# Patient Record
Sex: Female | Born: 1995 | Hispanic: Yes | Marital: Single | Smoking: Never smoker
Health system: Southern US, Community
[De-identification: ages and names within clinical notes are randomized; demographics above are authoritative.]

## PROBLEM LIST (undated history)

## (undated) ENCOUNTER — Inpatient Hospital Stay (HOSPITAL_COMMUNITY): Payer: Self-pay

## (undated) DIAGNOSIS — Z789 Other specified health status: Secondary | ICD-10-CM

## (undated) DIAGNOSIS — O26619 Liver and biliary tract disorders in pregnancy, unspecified trimester: Secondary | ICD-10-CM

## (undated) DIAGNOSIS — K831 Obstruction of bile duct: Secondary | ICD-10-CM

## (undated) DIAGNOSIS — O26649 Intrahepatic cholestasis of pregnancy, unspecified trimester: Secondary | ICD-10-CM

## (undated) HISTORY — DX: Obstruction of bile duct: K83.1

## (undated) HISTORY — DX: Intrahepatic cholestasis of pregnancy, unspecified trimester: O26.649

## (undated) HISTORY — DX: Obstruction of bile duct: O26.619

## (undated) HISTORY — PX: NO PAST SURGERIES: SHX2092

---
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
## 2015-01-06 ENCOUNTER — Ambulatory Visit (HOSPITAL_COMMUNITY)
Admission: RE | Admit: 2015-01-06 | Discharge: 2015-01-06 | Disposition: A | Discharge status: Home / Self Care | Payer: BLUE CROSS/BLUE SHIELD | Source: Ambulatory Visit | Attending: Nurse Practitioner | Admitting: Nurse Practitioner

## 2015-01-06 DIAGNOSIS — O43199 Other malformation of placenta, unspecified trimester: Secondary | ICD-10-CM | POA: Insufficient documentation

## 2015-01-06 DIAGNOSIS — Z364 Encounter for antenatal screening for fetal growth retardation: Secondary | ICD-10-CM | POA: Insufficient documentation

## 2015-01-06 DIAGNOSIS — Z3A33 33 weeks gestation of pregnancy: Secondary | ICD-10-CM | POA: Insufficient documentation

## 2015-01-06 DIAGNOSIS — Z36 Encounter for antenatal screening of mother: Secondary | ICD-10-CM | POA: Diagnosis not present

## 2015-01-06 DIAGNOSIS — Z3689 Encounter for other specified antenatal screening: Secondary | ICD-10-CM

## 2015-01-06 DIAGNOSIS — O43893 Other placental disorders, third trimester: Secondary | ICD-10-CM | POA: Insufficient documentation

## 2015-01-06 DIAGNOSIS — Z3A34 34 weeks gestation of pregnancy: Secondary | ICD-10-CM | POA: Insufficient documentation

## 2015-01-06 NOTE — Progress Notes (Signed)
Created in error

## 2015-01-13 ENCOUNTER — Other Ambulatory Visit (HOSPITAL_COMMUNITY): Payer: Self-pay | Admitting: Nurse Practitioner

## 2015-01-13 DIAGNOSIS — K831 Obstruction of bile duct: Secondary | ICD-10-CM

## 2015-01-13 DIAGNOSIS — O26613 Liver and biliary tract disorders in pregnancy, third trimester: Principal | ICD-10-CM

## 2015-01-15 ENCOUNTER — Inpatient Hospital Stay (HOSPITAL_COMMUNITY)
Admission: AD | Admit: 2015-01-15 | Discharge: 2015-01-18 | DRG: 775 | Disposition: A | Discharge status: Home / Self Care | Payer: BLUE CROSS/BLUE SHIELD | Source: Ambulatory Visit | Attending: Obstetrics & Gynecology | Admitting: Obstetrics & Gynecology

## 2015-01-15 ENCOUNTER — Encounter (HOSPITAL_COMMUNITY): Payer: Self-pay | Admitting: *Deleted

## 2015-01-15 ENCOUNTER — Inpatient Hospital Stay (HOSPITAL_COMMUNITY)
Admission: AD | Admit: 2015-01-15 | Discharge: 2015-01-15 | Disposition: A | Discharge status: Home / Self Care | Payer: BLUE CROSS/BLUE SHIELD | Source: Ambulatory Visit | Attending: Family Medicine | Admitting: Family Medicine

## 2015-01-15 ENCOUNTER — Inpatient Hospital Stay (HOSPITAL_COMMUNITY): Payer: BLUE CROSS/BLUE SHIELD | Admitting: Anesthesiology

## 2015-01-15 DIAGNOSIS — O26613 Liver and biliary tract disorders in pregnancy, third trimester: Secondary | ICD-10-CM | POA: Diagnosis present

## 2015-01-15 DIAGNOSIS — Z87891 Personal history of nicotine dependence: Secondary | ICD-10-CM | POA: Diagnosis not present

## 2015-01-15 DIAGNOSIS — O0933 Supervision of pregnancy with insufficient antenatal care, third trimester: Secondary | ICD-10-CM | POA: Diagnosis present

## 2015-01-15 DIAGNOSIS — K831 Obstruction of bile duct: Secondary | ICD-10-CM | POA: Insufficient documentation

## 2015-01-15 DIAGNOSIS — R509 Fever, unspecified: Secondary | ICD-10-CM | POA: Diagnosis not present

## 2015-01-15 DIAGNOSIS — B001 Herpesviral vesicular dermatitis: Secondary | ICD-10-CM | POA: Diagnosis not present

## 2015-01-15 DIAGNOSIS — Z3A35 35 weeks gestation of pregnancy: Secondary | ICD-10-CM | POA: Diagnosis present

## 2015-01-15 DIAGNOSIS — O411030 Infection of amniotic sac and membranes, unspecified, third trimester, not applicable or unspecified: Secondary | ICD-10-CM | POA: Diagnosis present

## 2015-01-15 DIAGNOSIS — B009 Herpesviral infection, unspecified: Secondary | ICD-10-CM

## 2015-01-15 LAB — URINALYSIS, ROUTINE W REFLEX MICROSCOPIC
Glucose, UA: NEGATIVE mg/dL
Hgb urine dipstick: NEGATIVE
KETONES UR: NEGATIVE mg/dL
Leukocytes, UA: NEGATIVE
Nitrite: NEGATIVE
PH: 6.5 (ref 5.0–8.0)
Protein, ur: NEGATIVE mg/dL
Specific Gravity, Urine: 1.015 (ref 1.005–1.030)
Urobilinogen, UA: 4 mg/dL — ABNORMAL HIGH (ref 0.0–1.0)

## 2015-01-15 LAB — CBC
HEMATOCRIT: 38.2 % (ref 36.0–46.0)
Hemoglobin: 12.9 g/dL (ref 12.0–15.0)
MCH: 28.5 pg (ref 26.0–34.0)
MCHC: 33.8 g/dL (ref 30.0–36.0)
MCV: 84.5 fL (ref 78.0–100.0)
PLATELETS: 165 10*3/uL (ref 150–400)
RBC: 4.52 MIL/uL (ref 3.87–5.11)
RDW: 13.5 % (ref 11.5–15.5)
WBC: 13.1 10*3/uL — ABNORMAL HIGH (ref 4.0–10.5)

## 2015-01-15 LAB — RAPID HIV SCREEN (WH-MAU): Rapid HIV Screen: NONREACTIVE

## 2015-01-15 LAB — GROUP B STREP BY PCR: Group B strep by PCR: NEGATIVE

## 2015-01-15 LAB — TYPE AND SCREEN
ABO/RH(D): A POS
Antibody Screen: NEGATIVE

## 2015-01-15 LAB — OB RESULTS CONSOLE GBS: STREP GROUP B AG: NEGATIVE

## 2015-01-15 MED ORDER — PROMETHAZINE HCL 25 MG/ML IJ SOLN
12.5000 mg | Freq: Four times a day (QID) | INTRAMUSCULAR | Status: DC | PRN
Start: 2015-01-15 — End: 2015-01-15
  Administered 2015-01-15: 12.5 mg via INTRAMUSCULAR

## 2015-01-15 MED ORDER — SODIUM CHLORIDE 0.9 % IJ SOLN
3.0000 mL | INTRAMUSCULAR | Status: DC | PRN
  Administered 2015-01-17: 3 mL via INTRAVENOUS
  Filled 2015-01-15: qty 3

## 2015-01-15 MED ORDER — PROMETHAZINE HCL 25 MG/ML IJ SOLN
25.0000 mg | Freq: Four times a day (QID) | INTRAMUSCULAR | Status: DC | PRN
  Filled 2015-01-15: qty 1

## 2015-01-15 MED ORDER — ZOLPIDEM TARTRATE 5 MG PO TABS: 5.0000 mg | ORAL_TABLET | Freq: Every evening | ORAL | Status: DC | PRN

## 2015-01-15 MED ORDER — LACTATED RINGERS IV SOLN
INTRAVENOUS | Status: DC
  Administered 2015-01-15: 13:00:00 via INTRAVENOUS

## 2015-01-15 MED ORDER — ONDANSETRON HCL 4 MG/2ML IJ SOLN: 4.0000 mg | Freq: Four times a day (QID) | INTRAMUSCULAR | Status: DC | PRN

## 2015-01-15 MED ORDER — PHENYLEPHRINE 40 MCG/ML (10ML) SYRINGE FOR IV PUSH (FOR BLOOD PRESSURE SUPPORT)
80.0000 ug | PREFILLED_SYRINGE | INTRAVENOUS | Status: DC | PRN
  Filled 2015-01-15: qty 2

## 2015-01-15 MED ORDER — PHENYLEPHRINE 40 MCG/ML (10ML) SYRINGE FOR IV PUSH (FOR BLOOD PRESSURE SUPPORT)
PREFILLED_SYRINGE | INTRAVENOUS | Status: AC
  Filled 2015-01-15: qty 20

## 2015-01-15 MED ORDER — DIPHENHYDRAMINE HCL 25 MG PO CAPS: 25.0000 mg | ORAL_CAPSULE | Freq: Four times a day (QID) | ORAL | Status: DC | PRN

## 2015-01-15 MED ORDER — OXYCODONE-ACETAMINOPHEN 5-325 MG PO TABS
2.0000 | ORAL_TABLET | ORAL | Status: DC | PRN
  Administered 2015-01-17 – 2015-01-18 (×2): 2 via ORAL

## 2015-01-15 MED ORDER — OXYCODONE-ACETAMINOPHEN 5-325 MG PO TABS
1.0000 | ORAL_TABLET | ORAL | Status: DC | PRN
Start: 2015-01-15 — End: 2015-01-18

## 2015-01-15 MED ORDER — BENZOCAINE-MENTHOL 20-0.5 % EX AERO
1.0000 "application " | INHALATION_SPRAY | CUTANEOUS | Status: DC | PRN
  Administered 2015-01-16: 1 via TOPICAL
  Filled 2015-01-15: qty 56

## 2015-01-15 MED ORDER — LIDOCAINE HCL (PF) 1 % IJ SOLN
30.0000 mL | INTRAMUSCULAR | Status: DC | PRN
  Filled 2015-01-15: qty 30

## 2015-01-15 MED ORDER — FENTANYL 2.5 MCG/ML BUPIVACAINE 1/10 % EPIDURAL INFUSION (WH - ANES)
INTRAMUSCULAR | Status: DC | PRN
  Administered 2015-01-15: 14 mL/h via EPIDURAL

## 2015-01-15 MED ORDER — DIPHENHYDRAMINE HCL 50 MG/ML IJ SOLN: 12.5000 mg | INTRAMUSCULAR | Status: DC | PRN

## 2015-01-15 MED ORDER — FLEET ENEMA 7-19 GM/118ML RE ENEM: 1.0000 | ENEMA | RECTAL | Status: DC | PRN

## 2015-01-15 MED ORDER — ACETAMINOPHEN 325 MG PO TABS
650.0000 mg | ORAL_TABLET | ORAL | Status: DC | PRN
  Administered 2015-01-15 – 2015-01-17 (×3): 650 mg via ORAL
  Filled 2015-01-15 (×3): qty 2

## 2015-01-15 MED ORDER — OXYTOCIN 40 UNITS IN LACTATED RINGERS INFUSION - SIMPLE MED: 1.0000 m[IU]/min | INTRAVENOUS | Status: DC

## 2015-01-15 MED ORDER — IBUPROFEN 600 MG PO TABS
600.0000 mg | ORAL_TABLET | Freq: Four times a day (QID) | ORAL | Status: DC
  Administered 2015-01-16 – 2015-01-18 (×11): 600 mg via ORAL
  Filled 2015-01-15 (×11): qty 1

## 2015-01-15 MED ORDER — LACTATED RINGERS IV SOLN
500.0000 mL | Freq: Once | INTRAVENOUS | Status: AC
  Administered 2015-01-15: 500 mL via INTRAVENOUS

## 2015-01-15 MED ORDER — FENTANYL 2.5 MCG/ML BUPIVACAINE 1/10 % EPIDURAL INFUSION (WH - ANES)
14.0000 mL/h | INTRAMUSCULAR | Status: DC | PRN
  Administered 2015-01-15: 14 mL/h via EPIDURAL

## 2015-01-15 MED ORDER — OXYTOCIN 40 UNITS IN LACTATED RINGERS INFUSION - SIMPLE MED: 62.5000 mL/h | INTRAVENOUS | Status: DC | PRN

## 2015-01-15 MED ORDER — FENTANYL CITRATE 0.05 MG/ML IJ SOLN
100.0000 ug | INTRAMUSCULAR | Status: DC | PRN
  Administered 2015-01-15 (×2): 100 ug via INTRAVENOUS
  Filled 2015-01-15: qty 2

## 2015-01-15 MED ORDER — LANOLIN HYDROUS EX OINT: TOPICAL_OINTMENT | CUTANEOUS | Status: DC | PRN

## 2015-01-15 MED ORDER — SODIUM CHLORIDE 0.9 % IJ SOLN
3.0000 mL | Freq: Two times a day (BID) | INTRAMUSCULAR | Status: DC
  Administered 2015-01-17 (×2): 3 mL via INTRAVENOUS

## 2015-01-15 MED ORDER — OXYTOCIN 40 UNITS IN LACTATED RINGERS INFUSION - SIMPLE MED
62.5000 mL/h | INTRAVENOUS | Status: DC
  Filled 2015-01-15: qty 1000

## 2015-01-15 MED ORDER — LACTATED RINGERS IV SOLN: 500.0000 mL | INTRAVENOUS | Status: DC | PRN

## 2015-01-15 MED ORDER — LIDOCAINE HCL (PF) 1 % IJ SOLN
INTRAMUSCULAR | Status: DC | PRN
  Administered 2015-01-15 (×2): 4 mL

## 2015-01-15 MED ORDER — SIMETHICONE 80 MG PO CHEW: 80.0000 mg | CHEWABLE_TABLET | ORAL | Status: DC | PRN

## 2015-01-15 MED ORDER — OXYCODONE-ACETAMINOPHEN 5-325 MG PO TABS
2.0000 | ORAL_TABLET | ORAL | Status: DC | PRN
  Filled 2015-01-15 (×2): qty 2

## 2015-01-15 MED ORDER — BUTORPHANOL TARTRATE 1 MG/ML IJ SOLN
2.0000 mg | Freq: Once | INTRAMUSCULAR | Status: AC
  Administered 2015-01-15: 2 mg via INTRAMUSCULAR
  Filled 2015-01-15: qty 2

## 2015-01-15 MED ORDER — TETANUS-DIPHTH-ACELL PERTUSSIS 5-2.5-18.5 LF-MCG/0.5 IM SUSP: 0.5000 mL | Freq: Once | INTRAMUSCULAR | Status: DC

## 2015-01-15 MED ORDER — WITCH HAZEL-GLYCERIN EX PADS: 1.0000 "application " | MEDICATED_PAD | CUTANEOUS | Status: DC | PRN

## 2015-01-15 MED ORDER — OXYTOCIN BOLUS FROM INFUSION
500.0000 mL | INTRAVENOUS | Status: DC
  Administered 2015-01-15: 500 mL via INTRAVENOUS

## 2015-01-15 MED ORDER — FENTANYL CITRATE 0.05 MG/ML IJ SOLN
INTRAMUSCULAR | Status: AC
  Filled 2015-01-15: qty 2

## 2015-01-15 MED ORDER — CITRIC ACID-SODIUM CITRATE 334-500 MG/5ML PO SOLN: 30.0000 mL | ORAL | Status: DC | PRN

## 2015-01-15 MED ORDER — DIBUCAINE 1 % RE OINT: 1.0000 "application " | TOPICAL_OINTMENT | RECTAL | Status: DC | PRN

## 2015-01-15 MED ORDER — FENTANYL 2.5 MCG/ML BUPIVACAINE 1/10 % EPIDURAL INFUSION (WH - ANES)
INTRAMUSCULAR | Status: AC
  Filled 2015-01-15: qty 125

## 2015-01-15 MED ORDER — TERBUTALINE SULFATE 1 MG/ML IJ SOLN: 0.2500 mg | Freq: Once | INTRAMUSCULAR | Status: DC | PRN

## 2015-01-15 MED ORDER — SENNOSIDES-DOCUSATE SODIUM 8.6-50 MG PO TABS
2.0000 | ORAL_TABLET | ORAL | Status: DC
  Administered 2015-01-16 – 2015-01-17 (×3): 2 via ORAL
  Filled 2015-01-15 (×3): qty 2

## 2015-01-15 MED ORDER — ONDANSETRON HCL 4 MG/2ML IJ SOLN: 4.0000 mg | INTRAMUSCULAR | Status: DC | PRN

## 2015-01-15 MED ORDER — EPHEDRINE 5 MG/ML INJ
10.0000 mg | INTRAVENOUS | Status: DC | PRN
  Filled 2015-01-15: qty 2

## 2015-01-15 MED ORDER — ONDANSETRON HCL 4 MG PO TABS: 4.0000 mg | ORAL_TABLET | ORAL | Status: DC | PRN

## 2015-01-15 MED ORDER — OXYCODONE-ACETAMINOPHEN 5-325 MG PO TABS: 1.0000 | ORAL_TABLET | ORAL | Status: DC | PRN

## 2015-01-15 MED ORDER — SODIUM CHLORIDE 0.9 % IV SOLN: 250.0000 mL | INTRAVENOUS | Status: DC | PRN

## 2015-01-15 MED ORDER — PRENATAL MULTIVITAMIN CH
1.0000 | ORAL_TABLET | Freq: Every day | ORAL | Status: DC
  Administered 2015-01-16 – 2015-01-18 (×3): 1 via ORAL
  Filled 2015-01-15 (×3): qty 1

## 2015-01-15 NOTE — Progress Notes (Signed)
Karina [name] is a 19 y.o. G1P0 at 2524w6d by ultrasound admitted for active labor  Subjective:   Objective: BP 121/61 mmHg  Pulse 93  Temp(Src) 97.9 F (36.6 C) (Oral)  Resp 18  SpO2 100%  LMP 04/17/2014      FHT:  FHR: 135 bpm, variability: moderate,  accelerations:  Present,  decelerations:  Absent UC:   regular, every 2  minutes SVE:   Dilation: Lip/rim Effacement (%): 100 Station: 0 Exam by:: Karina [name] CNM   Labs: Lab Results  Component Value Date   WBC 13.1* 01/15/2015   HGB 12.9 01/15/2015   HCT 38.2 01/15/2015   MCV 84.5 01/15/2015   PLT 165 01/15/2015    Assessment / Plan: Spontaneous labor, progressing normally  Labor: Progressing normally Preeclampsia:  no signs or symptoms of toxicity Fetal Wellbeing:  Category I Pain Control:  Epidural I/D:  n/a Anticipated MOD:  NSVD  Karina [name] 01/15/2015, 1:08 PM

## 2015-01-15 NOTE — Progress Notes (Signed)
MD reviewing risks and benefits with the pt and pt deciding to procede with the procedure

## 2015-01-15 NOTE — Progress Notes (Signed)
Written and verbal d/c instructions given and understanding voiced. 

## 2015-01-15 NOTE — H&P (Signed)
Karina [name] is a 19 y.o. female presenting for labor. Maternal Medical History:  Reason for admission: Contractions.   Contractions: Onset was 13-24 hours ago.   Frequency: regular.   Perceived severity is moderate.    Fetal activity: Perceived fetal activity is normal.   Last perceived fetal movement was within the past hour.      OB History    Gravida Para Term Preterm AB TAB SAB Ectopic Multiple Living   1              Past Medical History  Diagnosis Date  . Medical history non-contributory    Past Surgical History  Procedure Laterality Date  . No past surgeries     Family History: family history includes Hyperlipidemia in her father. Social History:  reports that she has quit smoking. She has never used smokeless tobacco. She reports that she does not drink alcohol or use illicit drugs.   Prenatal Transfer Tool  Maternal Diabetes: No Genetic Screening: Normal Maternal Ultrasounds/Referrals: Normal Fetal Ultrasounds or other Referrals:  None Maternal Substance Abuse:  No Significant Maternal Medications:  None Significant Maternal Lab Results:  None Other Comments:  pt is 35.[redacted] wks gestation  Review of Systems  Constitutional: Negative.   HENT: Negative.   Eyes: Negative.   Respiratory: Negative.   Cardiovascular: Negative.   Gastrointestinal: Positive for abdominal pain.  Genitourinary: Negative.   Musculoskeletal: Negative.   Skin: Negative.   Neurological: Negative.   Endo/Heme/Allergies: Negative.   Psychiatric/Behavioral: Negative.     Dilation: 7 Effacement (%): 100 Station: -1 Exam by:: jolynn Blood pressure 111/77, pulse 82, temperature 97.9 F (36.6 C), temperature source Oral, resp. rate 18, last menstrual period 04/17/2014. Maternal Exam:  Uterine Assessment: Contraction strength is moderate.  Contraction frequency is regular.   Abdomen: Fetal presentation: vertex  Introitus: Normal vulva.   Physical Exam  Constitutional: She  is oriented to person, place, and time. She appears well-developed and well-nourished.  HENT:  Head: Normocephalic.  Eyes: Pupils are equal, round, and reactive to light.  Cardiovascular: Normal rate, regular rhythm, normal heart sounds and intact distal pulses.   Respiratory: Effort normal and breath sounds normal.  GI: Soft. Bowel sounds are normal.  Genitourinary: Vagina normal.  Musculoskeletal: Normal range of motion.  Neurological: She is alert and oriented to person, place, and time. She has normal reflexes.  Skin: Skin is warm and dry.  Psychiatric: She has a normal mood and affect. Her behavior is normal. Judgment and thought content normal.    Prenatal labs: ABO, Rh: --/--/A POS (12/13 16100635) Antibody: NEG (12/13 0635) Rubella: 1.49 (12/13 0635) RPR: NON REAC (12/13 0635)  HBsAg: NEGATIVE (12/13 0635)  HIV: NONREACTIVE (12/13 [id number])  GBS:     Assessment/Plan: Admit and expect vag delivery   Karina [name] 01/15/2015, 9:27 AM

## 2015-01-15 NOTE — Progress Notes (Signed)
No progress with pushing, pt to labor down per Zerita Boersarlene Eura Mccauslin CNM

## 2015-01-15 NOTE — Anesthesia Procedure Notes (Signed)
Epidural Patient location during procedure: OB Start time: 01/15/2015 12:15 PM  Staffing Anesthesiologist: Karie SchwalbeJUDD, Elmore Hyslop JENNETTE Performed by: anesthesiologist   Preanesthetic Checklist Completed: patient identified, site marked, surgical consent, pre-op evaluation, timeout performed, IV checked, risks and benefits discussed and monitors and equipment checked  Epidural Patient position: sitting Prep: site prepped and draped and DuraPrep Patient monitoring: continuous pulse ox and blood pressure Approach: midline Location: L3-L4 Injection technique: LOR saline  Needle:  Needle type: Tuohy  Needle gauge: 17 G Needle length: 9 cm and 9 Needle insertion depth: 5 cm cm Catheter type: closed end flexible Catheter size: 19 Gauge Catheter at skin depth: 9 cm Test dose: negative  Assessment Events: blood not aspirated, injection not painful, no injection resistance, negative IV test and no paresthesia  Additional Notes Patient identified. Risks/Benefits/Options discussed with patient including but not limited to bleeding, infection, nerve damage, paralysis, failed block, incomplete pain control, headache, blood pressure changes, nausea, vomiting, reactions to medication both or allergic, itching and postpartum back pain. Confirmed with bedside nurse the patient's most recent platelet count. Confirmed with patient that they are not currently taking any anticoagulation, have any bleeding history or any family history of bleeding disorders. Patient expressed understanding and wished to proceed. All questions were answered. Sterile technique was used throughout the entire procedure. Please see nursing notes for vital signs. Test dose was given through epidural catheter and negative prior to continuing to dose epidural or start infusion. Warning signs of high block given to the patient including shortness of breath, tingling/numbness in hands, complete motor block, or any concerning symptoms with  instructions to call for help. Patient was given instructions on fall risk and not to get out of bed. All questions and concerns addressed with instructions to call with any issues or inadequate analgesia.

## 2015-01-15 NOTE — Discharge Instructions (Signed)
Preterm Birth °Preterm birth is a birth that happens before 37 weeks of pregnancy. Most pregnancies last about 39-41 weeks. Every week in the womb is important and is beneficial to the health of the infant. Infants born before 37 weeks of pregnancy are at a higher risk for complications. Depending on when the infant was born, he or she may be: °· Late preterm. Born between 32 weeks and 37 weeks of pregnancy. °· Very preterm. Born at less than 32 weeks of pregnancy. °· Extremely preterm. Born at less than 25 weeks of pregnancy. °The earlier a baby is born, the more likely the child will have issues related to prematurity. Complications and problems that can be seen in infants born too early include: °· Problems breathing (respiratory distress syndrome). °· Low birth weight. °· Problems feeding. °· Sleeping problems. °· Yellowing of the skin (jaundice). °· Infections such as pneumonia.  °Babies born very preterm or extremely preterm are at risk for more serious medical issues. These include: °· More severe breathing issues. °· Eyesight issues. °· Brain development issues (intraventricular hemorrhage). °· Behavioral and emotional development issues. °· Growth and developmental delays. °· Cerebral palsy. °· Serious feeding or bowel complications (necrotizing enterocolitis). °CAUSES  °There are two broad categories of preterm birth. °· Spontaneous preterm birth. This is a birth resulting from preterm labor (not medically induced) or preterm premature rupture of membranes (PPROM). °· Indicated preterm birth. This is a birth resulting from labor being medically induced due to health, personal, or social reasons. °RISK FACTORS °Preterm birth may be related to certain medical conditions, lifestyle factors, or demographic factors encountered by the mother or fetus. °· Medical conditions include: °¨ Multiple gestations (twins, triplets, and so on). °¨ Infection. °¨ Diabetes. °¨ Heart disease. °¨ Kidney disease. °¨ Cervical or  uterine abnormalities. °¨ Being underweight. °¨ High blood pressure or preeclampsia. °¨ Premature rupture of membranes (PROM). °¨ Birth defects in the fetus. °· Lifestyle factors include: °¨ Poor prenatal care. °¨ Poor nutrition or anemia. °¨ Cigarette smoking. °¨ Consuming alcohol. °¨ High levels of stress and lack of social or emotional support. °¨ Exposure to chemical or environmental toxins. °¨ Substance abuse. °· Demographic factors include: °¨ African-American ethnicity. °¨ Age (younger than 18 or older than 19 years of age). °¨ Low socioeconomic status. °Women with a history of preterm labor or who become pregnant within 18 months of giving birth are also at increased risk for preterm birth. °DIAGNOSIS  °Your health care provider may request additional tests to diagnose underlying complications resulting from preterm birth. Tests on the infant may include: °· Physical exam. °· Blood tests. °· Chest X-rays. °· Heart-lung monitoring. °TREATMENT  °After birth, special care will be taken to assess any problems or complications for the infant. Supportive care will be provided for the infant. Treatment depends on what problems are present and any complications that develop. Some preterm infants are cared for in a neonatal intensive care unit. In general, care may include: °· Maintaining temperature and oxygen in a clear heated box (baby isolette). °· Monitoring the infant's heart rate, breathing, and level of oxygen in the blood. °· Monitoring for signs of infection and, if needed, giving IV antibiotic medicine. °· Inserting a feeding tube (nose, mouth) or giving IV nutrition if unable to feed. °· Inserting a breathing tube (ventilation). °· Respiration support (continuous positive airway pressure [CPAP] or oxygen).  °Treatment will change as the infant builds up strength and is able to breathe and eat on his or her   own. For some infants, no special treatment is necessary. Parents may be educated on the potential  health risks of prematurity to the infant. °HOME CARE INSTRUCTIONS °· Understand your infant's special conditions and needs. It may be reassuring to learn about infant CPR. °· Monitor your infant in the car seat until he or she grows and matures. Infant car seats can cause breathing difficulties for preterm infants. °· Keep your infant warm. Dress your infant in layers and keep him or her away from drafts, especially in cold months of the year. °· Wash your hands thoroughly after going to the bathroom or changing a diaper. Late preterm infants may be more prone to infection. °· Follow all your health care provider's instructions for providing support and care to your preterm infant. °· Get support from organizations and groups that understand your challenges. °· Follow up with your infant's health care provider as directed. °Prevention °There are some things you can do to help lower your risk of having a preterm infant in the future. These include: °· Good prenatal care throughout the entire pregnancy. See a health care provider regularly for advice and tests. °· Management of underlying medical conditions. °· Proper self-care and lifestyle changes. °· Proper diet and weight control. °· Watching for signs of various infections. °SEEK MEDICAL CARE IF: °· Your infant has feeding difficulties. °· Your infant has sleeping difficulties. °· Your infant has breathing difficulties. °· Your infant's skin starts to look yellow. °· Your infant shows signs of infection, such as a stuffy nose, fever, crying, or bluish color of the skin. °FOR MORE INFORMATION °March of Dimes: www.marchofdimes.com °Prematurity.org: www.prematurity.org °Document Released: 01/18/2004 Document Revised: 08/18/2013 Document Reviewed: 05/27/2013 °ExitCare® Patient Information ©2015 [employer]. This information is not intended to replace advice given to you by your health care provider. Make sure you discuss any questions you have with your health  care provider. ° °

## 2015-01-15 NOTE — MAU Provider Note (Signed)
  History     CSN: [id number]  Arrival date and time: 01/15/15 0148   None     Chief Complaint  Patient presents with  . Contractions   HPI  Patient is 19 y.o. G1P0 2870w6d with cholestasis on ursodiol here with complaints of contractions, started at 9pm.  Hx of admission for preterm labor at 24w.  +FM, denies LOF, VB, vaginal discharge.   Past Medical History  Diagnosis Date  . Medical history non-contributory     Past Surgical History  Procedure Laterality Date  . No past surgeries      Family History  Problem Relation Age of Onset  . Hyperlipidemia Father     History  Substance Use Topics  . Smoking status: Former Games developermoker  . Smokeless tobacco: Never Used  . Alcohol Use: No    Allergies: No Known Allergies  Prescriptions prior to admission  Medication Sig Dispense Refill Last Dose  . hydrOXYzine (ATARAX/VISTARIL) 25 MG tablet Take 25 mg by mouth 4 (four) times daily.   01/14/2015 at Unknown time  . PRENATAL 28-0.8 MG TABS Take 1 tablet by mouth daily. 30 each 12 01/14/2015 at Unknown time  . ursodiol (ACTIGALL) 500 MG tablet Take 500 mg by mouth 2 (two) times daily.   01/14/2015 at Unknown time  . NIFEdipine (PROCARDIA XL) 30 MG 24 hr tablet Take 1 tablet (30 mg total) by mouth 2 (two) times daily. 60 tablet 3 11/06/2014 at 1830  . nitrofurantoin, macrocrystal-monohydrate, (MACROBID) 100 MG capsule Take 1 capsule (100 mg total) by mouth 2 (two) times daily. 14 capsule 0 11/06/2014 at 1830    Review of Systems  Constitutional: Negative for fever and chills.  Respiratory: Negative for cough and shortness of breath.   Cardiovascular: Negative for chest pain and leg swelling.  Gastrointestinal: Negative for heartburn, nausea, vomiting and diarrhea.  Genitourinary: Negative for dysuria, urgency, frequency and hematuria.  Skin: Positive for itching.  Neurological:       No headache   Physical Exam   Blood pressure 129/80, pulse 87, temperature 97.6 F (36.4 C),  resp. rate 20, height 5\' 1"  (1.549 m), weight 99 lb 3.2 oz (44.997 kg), last menstrual period 04/17/2014.  Physical Exam  Constitutional: She is oriented to person, place, and time. She appears well-developed and well-nourished.  Moderate distress with contractions  HENT:  Head: Normocephalic and atraumatic.  Eyes: Conjunctivae and EOM are normal.  Neck: Normal range of motion.  Cardiovascular: Normal rate.   Respiratory: Effort normal. No respiratory distress.  GI: Soft. Bowel sounds are normal. She exhibits no distension. There is no tenderness.  Musculoskeletal: Normal range of motion. She exhibits no edema.  Neurological: She is alert and oriented to person, place, and time.  Skin: Skin is warm and dry. No erythema.   Dilation: 2 Effacement (%): 70 Cervical Position: Middle Station: -1 Presentation: Vertex Exam by:: Quintella BatonJo Barham RNC  MAU Course  Procedures  MDM NST reactive q683min contractions  Assessment and Plan  Patient is 19 y.o. G1P0 7270w6d reporting contractions likely secondary to preterm labor - fetal kick counts reinforced - mild improvement with 1L PO hydration - 12mg  phenergan IM, 2mg  stadol IM - no cervical change => d/c no, no tocolysis indicated at this time - given very regular contractions GBS PCR ordered   Narcissus Detwiler ROCIO 01/15/2015, 4:43 AM

## 2015-01-15 NOTE — Progress Notes (Signed)
Unable to tolerate Left Lateral sims

## 2015-01-15 NOTE — Progress Notes (Signed)
Dr Acosta 

## 2015-01-15 NOTE — MAU Note (Signed)
Contractions now every 5 min. Closer and stronger, small amt of bloody show

## 2015-01-15 NOTE — Progress Notes (Signed)
Dr Loreta AveAcosta notified of pt's repeat sve with no change. Aware pt requesting pain med. Pain med order received and pt stable for d/c home.

## 2015-01-15 NOTE — MAU Note (Signed)
Contractions since 2100. Denies LOF or bleeding.

## 2015-01-15 NOTE — Progress Notes (Signed)
CNM notified of VE and pitocin orders recievied.

## 2015-01-15 NOTE — Anesthesia Preprocedure Evaluation (Signed)
Anesthesia Evaluation  Patient identified by MRN, date of birth, ID band Patient awake    Reviewed: Allergy & Precautions, NPO status , Patient's Chart, lab work & pertinent test results  History of Anesthesia Complications Negative for: history of anesthetic complications  Airway Mallampati: II  TM Distance: >3 FB Neck ROM: Full    Dental no notable dental hx. (+) Dental Advisory Given   Pulmonary former smoker,  breath sounds clear to auscultation  Pulmonary exam normal       Cardiovascular negative cardio ROS  Rhythm:Regular Rate:Normal     Neuro/Psych negative neurological ROS  negative psych ROS   GI/Hepatic negative GI ROS, Neg liver ROS,   Endo/Other  negative endocrine ROS  Renal/GU negative Renal ROS  negative genitourinary   Musculoskeletal negative musculoskeletal ROS (+)   Abdominal   Peds negative pediatric ROS (+)  Hematology negative hematology ROS (+)   Anesthesia Other Findings   Reproductive/Obstetrics (+) Pregnancy                             Anesthesia Physical Anesthesia Plan  ASA: II  Anesthesia Plan: Epidural   Post-op Pain Management:    Induction:   Airway Management Planned:   Additional Equipment:   Intra-op Plan:   Post-operative Plan:   Informed Consent: I have reviewed the patients History and Physical, chart, labs and discussed the procedure including the risks, benefits and alternatives for the proposed anesthesia with the patient or authorized representative who has indicated his/her understanding and acceptance.   Dental advisory given  Plan Discussed with:   Anesthesia Plan Comments:         Anesthesia Quick Evaluation  

## 2015-01-16 ENCOUNTER — Ambulatory Visit (HOSPITAL_COMMUNITY): Admission: RE | Admit: 2015-01-16 | Payer: BLUE CROSS/BLUE SHIELD | Source: Ambulatory Visit

## 2015-01-16 ENCOUNTER — Ambulatory Visit (HOSPITAL_COMMUNITY): Payer: BLUE CROSS/BLUE SHIELD

## 2015-01-16 LAB — HEPATITIS B SURFACE ANTIGEN: Hepatitis B Surface Ag: NEGATIVE

## 2015-01-16 LAB — RPR: RPR Ser Ql: NONREACTIVE

## 2015-01-16 NOTE — Anesthesia Postprocedure Evaluation (Signed)
  Anesthesia Post-op Note  Patient: Karina [name]  Procedure(s) Performed: * No procedures listed *  Patient Location: Mother/Baby  Anesthesia Type:Epidural  Level of Consciousness: awake, alert , oriented and patient cooperative  Airway and Oxygen Therapy: Patient Spontanous Breathing  Post-op Pain: none  Post-op Assessment: Post-op Vital signs reviewed, Patient's Cardiovascular Status Stable, Respiratory Function Stable, Patent Airway, No headache, No backache, No residual numbness and No residual motor weakness  Post-op Vital Signs: Reviewed and stable  Last Vitals:  Filed Vitals:   01/16/15 0655  BP: 99/67  Pulse: 67  Temp: 36.4 C  Resp: 16    Complications: No apparent anesthesia complications

## 2015-01-16 NOTE — Lactation Note (Signed)
This note was copied from the chart of Karina Briteny Friedel. Lactation Consultation Note  Patient Name: Karina [name] ZOXWR'UToday's Date: 01/16/2015   [facility] RN, Madelyn BrunnerMichelle Hyatt concerning this mom and baby's breastfeeding status.  RN informs LC that mom has decided to pump only and is obtaining up to 8 ml's of ebm with DEBP.  Per RN, mom is not having any difficulty with DEBP.  Mom to call for help as needed.  Maternal Data    Feeding Feeding Type: Bottle Fed - Formula  LATCH Score/Interventions              N/A - mom has decided to pump only and bottle-feed ebm and/or formula to her LPI        Lactation Tools Discussed/Used   RN has reviewed pumping guidelines  Consult Status   LC to follow tomorrow and as needed   Lynda RainwaterBryant, Ceil Roderick Parmly 01/16/2015, 7:58 PM

## 2015-01-16 NOTE — Progress Notes (Signed)
Clinical Social Work Department PSYCHOSOCIAL ASSESSMENT - MATERNAL/CHILD 01/16/2015  Patient:  [name],[name]  Account Number:  [id number]  Admit Date:  01/15/2015  Childs Name:   Karina [name]   Clinical Social Worker:  Jacquez Sheetz, CLINICAL SOCIAL WORKER   Date/Time:  01/16/2015 03:30 PM  Date Referred:  01/15/2015   Referral source  Central Nursery     Referred reason  LPNC  Substance Abuse   Other referral source:    I:  FAMILY / HOME ENVIRONMENT Child's legal guardian:  PARENT  Guardian - Name Guardian - Age Guardian - Address  Karina [name] 18 [address] [place], [place] [address]  Karina [name]  same as above   Other household support members/support persons Name Relationship DOB   MOTHER    FATHER    Other support:   [name] reported that she lives with parents and the FOB. She endorsed strong family supporit.    II  PSYCHOSOCIAL DATA Information Source:  Family Interview  Financial and Community Resources Employment:   FOB reported that he is employed.   Financial resources:  Private Insurance If Medicaid - County:  [place]  School / Grade:  N/A Maternity Care Coordinator / Child Services Coordination / Early Interventions:   None reported  Cultural issues impacting care:   None reported    III  STRENGTHS Strengths  Adequate Resources  Home prepared for Child (including basic supplies)  Supportive family/friends   Strength comment:    IV  RISK FACTORS AND CURRENT PROBLEMS Current Problem:  YES   Risk Factor & Current Problem Patient Issue Family Issue Risk Factor / Current Problem Comment  Late PNC Y N [name] arrived late to prenatal care. She initiated care at 28 weeks. Infant's UDS is negative and MDS is pending.  Substance Abuse Y N OB record reports history of THC use, but [name] denied any and all THC use.         V  SOCIAL WORK ASSESSMENT CSW received consult for late prenatal care (initiated care after 28 weeks) and THC use.  [name]  presented as easily engaged and receptive to the visit. She provided consent for the FOB and the FOB's 2 brothers to be present during the visit.  The [name] displayed a full range in affect and presented in a pleasant mood. She appeared more nervous as CSW inquired about substance use.  CSW noted that the [name] was smiling brightly as she discussed her transition into the postpartum period and as she reflected upon her experiences as a mother thus far. She was bonding and providing skin-to-skin with the infant during the entire visit.   [name] denied questions, concerns, or needs as she transitions into the postpartum period. She shared that she has had a positive experience at the hospital, and feels prepared to be a mother. She discussed belief that she knows what to expect since she has experience watching her sister become a mother. [name] endorsed strong family support, and shared that they have the home well-prepared for the infant's arrival.   [name] denied mental health history, and denied symptoms during the pregnancy.  The FOB shared that the [name] is typically very "calm" and "low key".  CSW provided education on the baby blues and postpartum depression, and they were agreeable to notifying the [name]'s medical provider if they note symptoms.    CSW inquired about late prenatal care.  [name] shared that they were originally unsure where they were going to be "living". The [name] and FOB   shared that they had been living in [place] with the FOB's family, but then decided to move back to [place] (where the [name]'s parents live) since the [name] became homesick for her family. They shared that there was additional barriers to care since they found it difficult to find a provider who was accepting new patients.  The [name] shared belief that she was told different due dates along the way and is unsure of her actual gestational age when she initiated care.  [name] verbalized understanding of the hospital drug screen policy, and denied all use  during pregnancy.  CSW directly inquired about THC use, but the [name] continued to deny use.  The [name] and FOB are aware that a CPS report will be made if the infant has a positive drug screen.   The [name] and FOB denied additional questions, concerns, or needs at this time. They verbalized awareness of CSW availability, and were receptive to contacting CSW if needs arise.   VI SOCIAL WORK PLAN Social Work Plan  Patient/Family Education  No Further Intervention Required / No Barriers to Discharge   Type of pt/family education:   Postpartum depression  Hospital drug screen policy   If child protective services report - county:  N/A If child protective services report - date:  N/A Information/referral to community resources comment:   No referrals as no needs were identified.   Other social work plan:   CSW to follow up as needed or upon family request.    CSW to monitor MDS and will make a CPS report if positive for substances.     

## 2015-01-16 NOTE — Progress Notes (Signed)
Post Partum Day 1 Subjective: no complaints, up ad lib, voiding and tolerating PO  Objective: Blood pressure 99/67, pulse 67, temperature 97.5 F (36.4 C), temperature source Oral, resp. rate 16, last menstrual period 04/17/2014, SpO2 100 %, unknown if currently breastfeeding.  Physical Exam:  General: alert, cooperative, appears stated age and no distress Lochia: appropriate Uterine Fundus: firm Incision: healing well DVT Evaluation: No evidence of DVT seen on physical exam. Negative Homan's sign. No cords or calf tenderness. No significant calf/ankle edema.   Recent Labs  01/15/15 0930  HGB 12.9  HCT 38.2    Assessment/Plan: Plan for discharge tomorrow   LOS: 1 day   [name], MARIE DARLENE 01/16/2015, 7:03 AM

## 2015-01-16 NOTE — Lactation Note (Addendum)
This note was copied from the chart of Karina Jazari Ptacek. Lactation Consultation Note New mom, DEBP in rm. Mom to sleepy earlier to pump. Mom awake and receptive for teaching. Mom shown how to use DEBP & how to disassemble, clean, & reassemble parts. Mom knows to pump q3h for 15-20 min. Mom pumped 4ml colostrum. Mom encouraged to do skin-to-skin. Mom encouraged to waken baby for feeds.  Mom has good everted nipples w/round perky breast. Encouraged to massage at intervals during pumping and BF. Hand expression demonstrated. LPI information sheet reviewed. W/baby being 4lbs 15 oz. Explained must be aggressive in breast feeding baby. And supplementing. Stimulated baby to attempt to put to breast, baby wouldn't have any interest in BF at this time. W/gloved finger assessed suck. Baby bites, occasionally would suckle on finger. Colostrum given in bottle w/slow flow nipple. Mom choice is breast/formula/bottle. Gave formula to equal amount needed to supplementing in bottle. Took both down well at interval.  Referred to Baby and Me Book in Breastfeeding section Pg. 22-23 for position options and Proper latch demonstration.[facility]/LC brochure given w/resources, support groups and LC services.  Patient Name: Karina [name] JXBJY'NToday's Date: 01/16/2015 Reason for consult: Initial assessment   Maternal Data Has patient been taught Hand Expression?: Yes Does the patient have breastfeeding experience prior to this delivery?: No  Feeding Feeding Type: Breast Milk Nipple Type: Slow - flow Length of feed: 0 min  LATCH Score/Interventions Latch: Too sleepy or reluctant, no latch achieved, no sucking elicited. Intervention(s): Waking techniques;Teach feeding cues;Skin to skin  Audible Swallowing: None  Type of Nipple: Everted at rest and after stimulation  Comfort (Breast/Nipple): Soft / non-tender     Hold (Positioning): Assistance needed to correctly position infant at breast and maintain  latch. Intervention(s): Skin to skin;Position options;Support Pillows;Breastfeeding basics reviewed  LATCH Score: 5  Lactation Tools Discussed/Used Tools: Pump Breast pump type: Double-Electric Breast Pump Pump Review: Setup, frequency, and cleaning;Milk Storage Initiated by:: Peri JeffersonL. Quinterius Gaida RN Date initiated:: 01/16/15   Consult Status Consult Status: Follow-up Date: 01/16/15 Follow-up type: In-patient    Charyl DancerCARVER, Akira Adelsberger G 01/16/2015, 4:29 AM

## 2015-01-17 LAB — URINALYSIS, ROUTINE W REFLEX MICROSCOPIC
BILIRUBIN URINE: NEGATIVE
GLUCOSE, UA: 100 mg/dL — AB
HGB URINE DIPSTICK: NEGATIVE
KETONES UR: NEGATIVE mg/dL
Leukocytes, UA: NEGATIVE
Nitrite: NEGATIVE
Protein, ur: NEGATIVE mg/dL
Specific Gravity, Urine: 1.01 (ref 1.005–1.030)
Urobilinogen, UA: 1 mg/dL (ref 0.0–1.0)
pH: 7 (ref 5.0–8.0)

## 2015-01-17 LAB — CBC WITH DIFFERENTIAL/PLATELET
BASOS ABS: 0 10*3/uL (ref 0.0–0.1)
Basophils Relative: 0 % (ref 0–1)
EOS ABS: 0 10*3/uL (ref 0.0–0.7)
Eosinophils Relative: 0 % (ref 0–5)
HCT: 30.9 % — ABNORMAL LOW (ref 36.0–46.0)
Hemoglobin: 10.5 g/dL — ABNORMAL LOW (ref 12.0–15.0)
Lymphocytes Relative: 6 % — ABNORMAL LOW (ref 12–46)
Lymphs Abs: 0.9 10*3/uL (ref 0.7–4.0)
MCH: 28.5 pg (ref 26.0–34.0)
MCHC: 34 g/dL (ref 30.0–36.0)
MCV: 84 fL (ref 78.0–100.0)
Monocytes Absolute: 1 10*3/uL (ref 0.1–1.0)
Monocytes Relative: 7 % (ref 3–12)
NEUTROS ABS: 12.5 10*3/uL — AB (ref 1.7–7.7)
NEUTROS PCT: 87 % — AB (ref 43–77)
Platelets: 177 10*3/uL (ref 150–400)
RBC: 3.68 MIL/uL — ABNORMAL LOW (ref 3.87–5.11)
RDW: 13.9 % (ref 11.5–15.5)
WBC: 14.4 10*3/uL — ABNORMAL HIGH (ref 4.0–10.5)

## 2015-01-17 MED ORDER — ACETAMINOPHEN 325 MG PO TABS
650.0000 mg | ORAL_TABLET | Freq: Once | ORAL | Status: AC
  Administered 2015-01-17: 650 mg via ORAL
  Filled 2015-01-17: qty 2

## 2015-01-17 MED ORDER — ACYCLOVIR 400 MG PO TABS
400.0000 mg | ORAL_TABLET | Freq: Every day | ORAL | Status: DC
  Administered 2015-01-17 – 2015-01-18 (×3): 400 mg via ORAL
  Filled 2015-01-17 (×9): qty 1

## 2015-01-17 MED ORDER — LACTATED RINGERS IV BOLUS (SEPSIS)
1000.0000 mL | Freq: Once | INTRAVENOUS | Status: AC
  Administered 2015-01-17: 1000 mL via INTRAVENOUS

## 2015-01-17 MED ORDER — LACTATED RINGERS IV SOLN: INTRAVENOUS | Status: DC

## 2015-01-17 NOTE — Progress Notes (Signed)
Patient was cold earlier in evening and turned room temp up to 80 degrees, put on sweater, and was under blankets.  At 0015, she c/o being hot. Oral temp 102.1.  Room temp decreased, removed sweater, took off blankets, gave scheduled ibuprofen.  Called Misty StanleyLisa Leftwich-Kirby who asked to recheck temp in 30 minutes and call her back.

## 2015-01-17 NOTE — Progress Notes (Addendum)
Went to evaluate Ms Vanderlinden following report of abdominal pain, that she could not describe and large clot per vagina.  She reports pain is dull and deep, "it is not sharp, but is kind of sharp," points to suprapubic area. Tylenol has not improved this abdominal pain.   On my exam, no tenderness to palpation in abdomen, fundus is firm and nontender to palpation.   I looked at the concerning clot, about quarter-sized in diameter, gel-like consistency with red & dark blood.    A/P: (1) Abdominal pain likely secondary to postpartum uterine contractions, recommended prn percocet per routine postpartum orders.   (2) Observed clots wnl for expected postpartum course: counseled pt regarding appearance of clots/lochia and expectation that volume should decrease over time.  If bright red, active bleeding, increasing amount, notify nurse for further evaluation.   Patient evaluation, A/P discussed with Fredirick LatheKristy Acosta, MD.   Bluford MainHenson, Kyrsten Deleeuw MD 11:19 PM

## 2015-01-17 NOTE — Progress Notes (Signed)
Recheck of patient's temp 102.9 oral. Room temp feels appropriate, patient is wearing hospital gown and is under sheet only.  Called Xcel EnergyLisa Leftwich-Kirby; awaiting orders.

## 2015-01-17 NOTE — Lactation Note (Signed)
This note was copied from the chart of Karina [name]. Lactation Consultation Note: Infant is 8041 hours old. Infant is a LPI at 35.6 weeks, weight 4-15 and now only down 2 %. Mother is exclusively pumping with a DEBP every 2-3 hours, she is pumping 20 ml with each pumping . She is bottle feeding infant now. Mother states that she has attempt to breastfeed infant several times. She states that infant is sleepy at the breast and clamps down and bits. Mother states she is active with WIC. A WIC referral form was faxed over for an electric pump. Mother states her plan is to start to breastfeed infant again. Advised mother to page for next feeding to have assistance with feeding. Mother was given a hand pump with instructions. Advised to continue to pump every 2-3 hours. Mother advised in teaching plan of engorgement from baby and me book. Mother is aware of available LC services and community support.  Patient Name: Karina [name] Reason for consult: Follow-up assessment   Maternal Data    Feeding Feeding Type: Breast Milk  LATCH Score/Interventions                      Lactation Tools Discussed/Used     Consult Status Consult Status: Follow-up Date: 01/17/15 Follow-up type: In-patient    Stevan BornKendrick, [name], 10:12 AM

## 2015-01-17 NOTE — Progress Notes (Signed)
Karina [name] has seen and examined patient.  IV started left hand by house coverage with 1000 cc bolus of LR running at 250cc/hr. CBC sent, cath UA sent, acetaminophen 650 mg given.

## 2015-01-17 NOTE — Progress Notes (Addendum)
Went to evaluate Mrs. Karina [name] following reports of fever 103.1. Notes chills and new pruritic rash around right side of mouth. Denies any other rashes or lesions.  No intraoral lesions noted. No abdominal pain noted. CBC showed WBC of 14.4 on 3/7, with little change from 13.1 at admission. Urinalysis normal. Acetaminphen PRN fevers. Will continue to monitor fever curve and progression of symptoms. Will discuss use of antibiotics with upper level resident.  Dr. Caroleen Hammanumley [facility], PGY-1  Reevaluated patient, has right perioral lesion that is new, has never had before.  Abdomen soft, fundus firm and nontender, no cough, no other complaints.  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  Discussed appropriate hand hygiene, caution with baby.  [name],Karina Lupien ROCIO, MD 9:37 PM

## 2015-01-17 NOTE — Progress Notes (Signed)
Pt has the chills and a temperature of 103.1 orally. Dr. Caroleen Hammanumley notified.

## 2015-01-17 NOTE — Progress Notes (Signed)
Post Partum Day #2 Subjective:  Maryan Ruedsperanza A Kulkarni is a 19 y.o. G1P0101 858w6d s/p SVD.  Fever of 102 noted overnight, improved with IV fluids.  Pt denies problems with ambulating, voiding or po intake.  She denies nausea or vomiting.  Pain is well controlled.  She has had flatus. She has not had bowel movement.  Lochia Minimal.  Plan for birth control is undecided.  Method of Feeding: Breast  Objective: Blood pressure 93/46, pulse 93, temperature 97.7 F (36.5 C), temperature source Oral, resp. rate 18, last menstrual period 04/17/2014, SpO2 98 %, unknown if currently breastfeeding.  Physical Exam:  General: alert, cooperative and no distress Lochia:normal flow Chest: CTAB, no increased work of breathing Heart: RRR no m/r/g Abdomen: soft, nontender,  Uterine Fundus: firm DVT Evaluation: No evidence of DVT seen on physical exam. Extremities: No edema   Recent Labs  01/15/15 0930 01/17/15 0150  HGB 12.9 10.5*  HCT 38.2 30.9*    Assessment/Plan:  ASSESSMENT: Sylwia A Linderman is a 19 y.o. G1P0101 308w6d s/p SVD. Fever of 102 noted overnight.  Plan for discharge tomorrow, Breastfeeding and Contraception undecided. Will discharge after 24hr afebrile.    LOS: 2 days   Araceli BoucheRumley, [place] N 01/17/2015, 8:44 AM

## 2015-01-18 DIAGNOSIS — B009 Herpesviral infection, unspecified: Secondary | ICD-10-CM

## 2015-01-18 MED ORDER — ACYCLOVIR 400 MG PO TABS: 400.0000 mg | ORAL_TABLET | Freq: Every day | ORAL | Status: DC

## 2015-01-18 MED ORDER — ACETAMINOPHEN 325 MG PO TABS: 650.0000 mg | ORAL_TABLET | ORAL | Status: DC | PRN

## 2015-01-18 MED ORDER — IBUPROFEN 600 MG PO TABS: 600.0000 mg | ORAL_TABLET | Freq: Four times a day (QID) | ORAL | Status: DC

## 2015-01-18 NOTE — Lactation Note (Signed)
This note was copied from the chart of Karina Vernica Mullings. Lactation Consultation Note;   Mother states that she is latching infant and infant is feeding better at the breast. She is supplementing with EBM . Mother has 45-50 ml of EBM at bedside ready to give to infant with a bottle. Mother has an appointment with WIC at 1:30 today to pick up an electric pump. Advised mother to continue to offer breast before bottles. Suggested that mother have Staff Nurse or LC to observe infant feeding at breast. Discussed need to feed infant 8-12 times daily and give infant EBM per supplemental guidelines. Mother is aware of available services and is active with WIC. Mother to page to have next feeding assessed.   Patient Name: Karina [name] UUVOZ'DToday's Date: 01/18/2015 Reason for consult: Follow-up assessment   Maternal Data    Feeding Feeding Type: Bottle Fed - Breast Milk  LATCH Score/Interventions                      Lactation Tools Discussed/Used     Consult Status Consult Status: Complete    Michel BickersKendrick, Karina [name] 01/18/2015, 9:35 AM

## 2015-01-18 NOTE — Progress Notes (Signed)
Notified MD, Bluford MainAmber Henson concerning patient c/o of abdominal pain and passing of clot. She evaluated patient and recommended percocet for patients pain. Also she was not impressed with clot and stated it was normal. Will continue to monitor patient.

## 2015-01-18 NOTE — Discharge Summary (Signed)
Obstetric Discharge Summary Reason for Admission:Active labor Prenatal Procedures: none Intrapartum Procedures: spontaneous vaginal delivery Postpartum Procedures: none Complications-Operative and Postpartum: none   Karina [name] is a 19 y.o. female who presented in active labor.  At 4:17 PM on 01/15/2015 a viable female was delivered via SVD.  APGAR:7 ,9.   Anesthesia: Epidural.  Lacerations: 1 degree r labial tear.  Developed fever to 103.1 on PPD #2, likely secondary to initial HSV outbreak on R corner of lip; started on acyclovir for tx.    Contraception plan: Depo provera Sex of child: female Feeding: breast/bottle Baby may stay longer for renal abnormalities.    HEMOGLOBIN  Date Value Ref Range Status  01/17/2015 10.5* 12.0 - 15.0 g/dL Final    Comment:    DELTA CHECK NOTED REPEATED TO VERIFY   10/01/2012 14.0 12.2 - 16.2 g/dL Final   HCT  Date Value Ref Range Status  01/17/2015 30.9* 36.0 - 46.0 % Final   HCT, POC  Date Value Ref Range Status  10/01/2012 44.2 37.7 - 47.9 % Final    Physical Exam:  General: alert, cooperative, appears stated age and no distress Lochia: appropriate Uterine Fundus: firm Incision: healing well DVT Evaluation: No evidence of DVT seen on physical exam.  Discharge Diagnoses: Term Pregnancy-delivered and Amnionitis  Discharge Information: Date: 01/18/2015 Activity: pelvic rest   Diet: routine  Medications: Ibuprofen Condition: stable Instructions: refer to practice specific booklet Discharge to: home Follow-up Information    [facility] [place] Call in 4 weeks.   Contact information:   [address] [place] [phone]      Newborn Data: Live born female  Birth Weight: 4 lb 15.4 oz (2250 g) APGAR: 7, 8  Daena Alper 01/18/2015, 9:11 AM

## 2015-01-19 ENCOUNTER — Ambulatory Visit: Payer: Self-pay

## 2015-01-19 NOTE — Consult Note (Signed)
Created in error

## 2015-01-19 NOTE — Lactation Note (Signed)
This note was copied from the chart of Karina [name]. Lactation Consultation Note  Patient Name: Karina Clyda Hurdlesperanza Verry RUEAV'WToday's Date: 01/19/2015 Reason for consult: Follow-up assessment   Follow-up with mom and baby.  Baby is being discharged today.  Mom reports pumping 60+ ml with each pumping.  Reports pumping before each feeding. Supplementing with either EBM or formula.  Encouraged mom to latch baby and allow to feed for about 30 minutes and then pump after breastfeeding; instructed she could then use the milk for supplementation after next breastfeeding.  Encouraged to continue using supplementation guideline and increasing amounts as needed.  Encouraged pumping at least 8 times per day.  Infant was cueing while in room and FOB prepared a bottle to feed baby.  Reviewed feeding cues.  [facility]  LC made sure mom had all pump pieces packed prior to discharge.  Encouraged to call for questions after discharge as needed.    Lactation Tools Discussed/Used WIC Program: Yes   Consult Status Consult Status: Complete    Lendon KaVann, Henna Derderian Walker 01/19/2015, 10:55 AM

## 2015-01-25 ENCOUNTER — Inpatient Hospital Stay (HOSPITAL_COMMUNITY): Admission: RE | Admit: 2015-01-25 | Payer: BLUE CROSS/BLUE SHIELD | Source: Ambulatory Visit

## 2015-06-14 IMAGING — US US OB FOLLOW-UP
1 series · 12 of 28 positions shown · non-contrast
Comparison: none

[Series 1: us ob follow up · 12 of 77 slices shown]
[im 3/77]
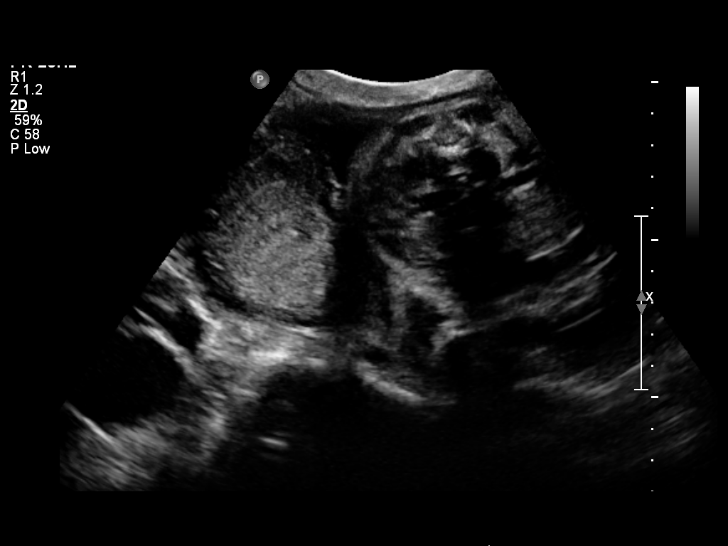
[im 9/77]
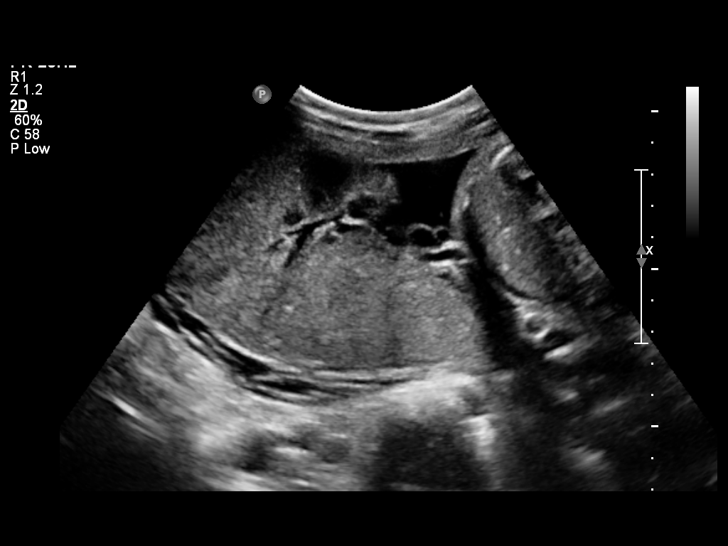
[im 15/77]
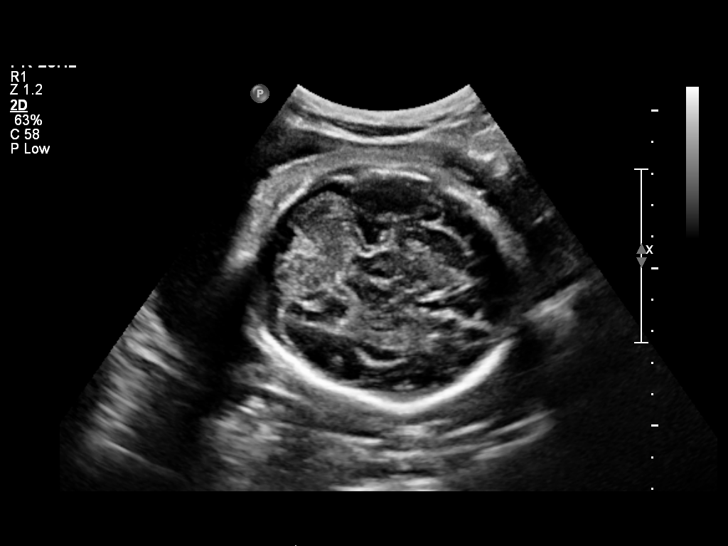
[im 23/77]
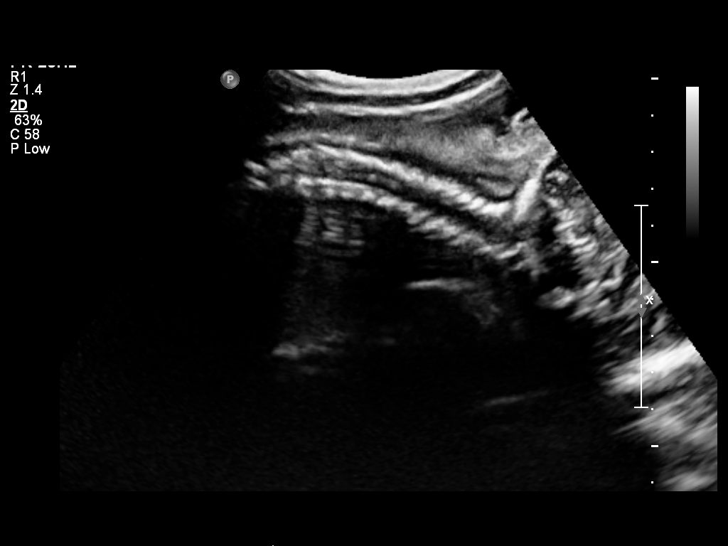
[im 29/77]
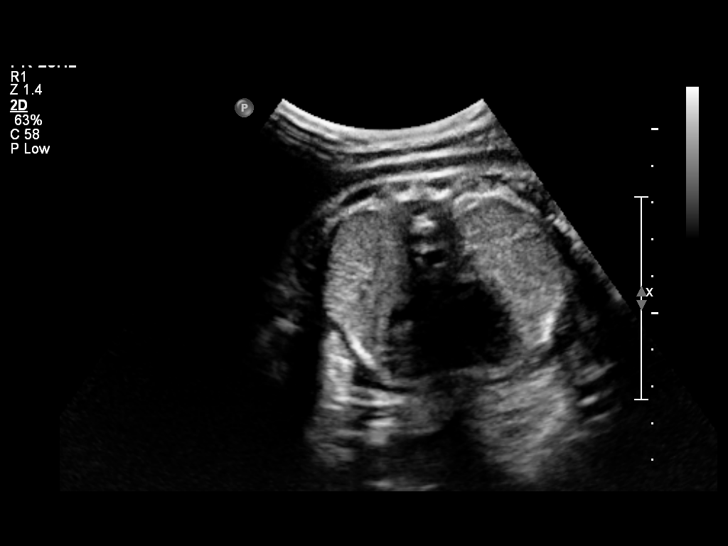
[im 34/77]
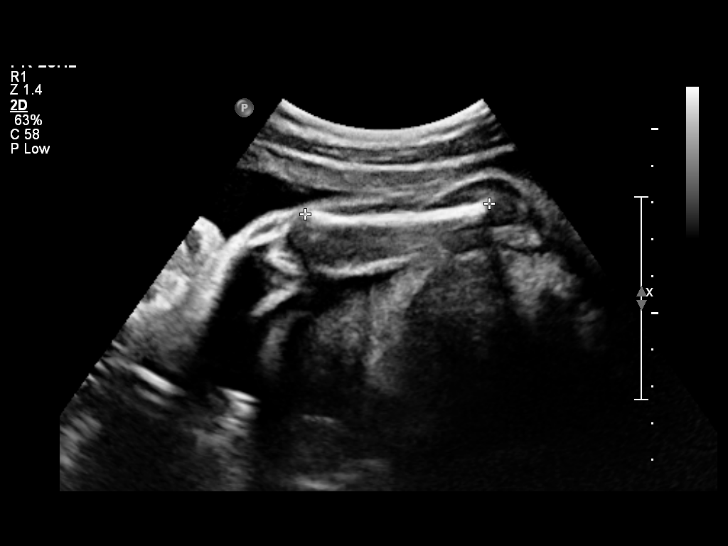
[im 43/77]
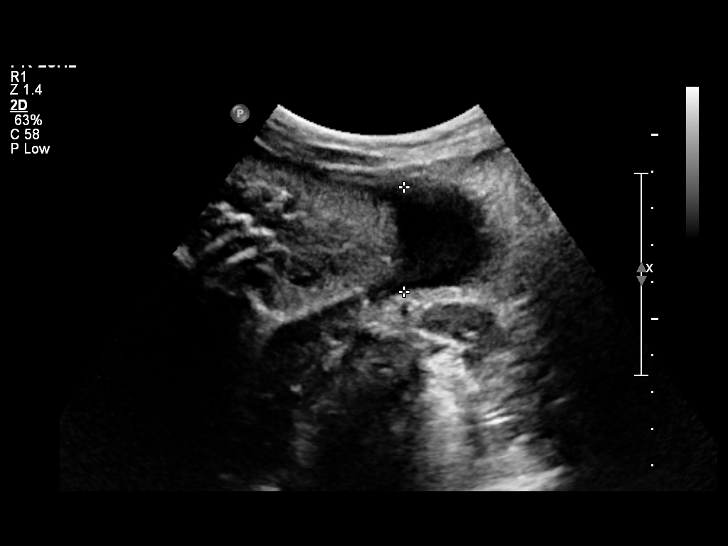
[im 48/77]
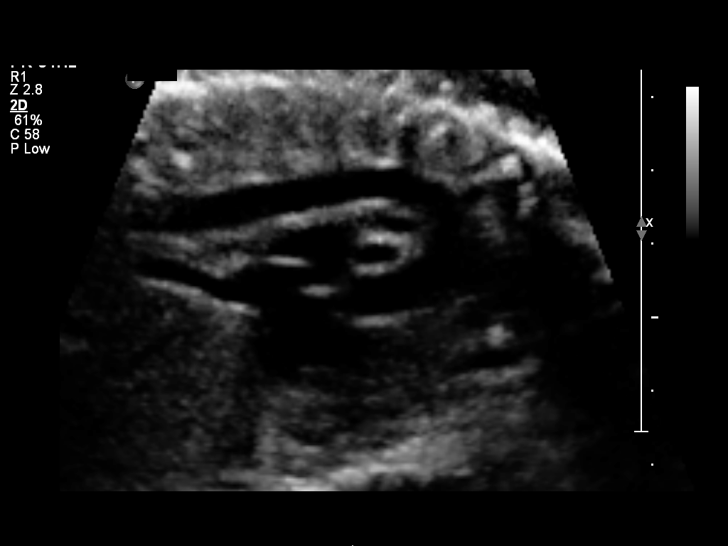
[im 54/77]
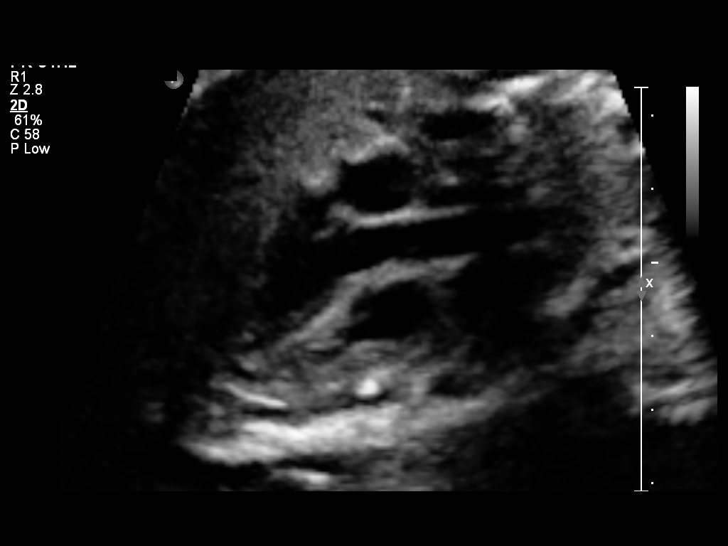
[im 62/77]
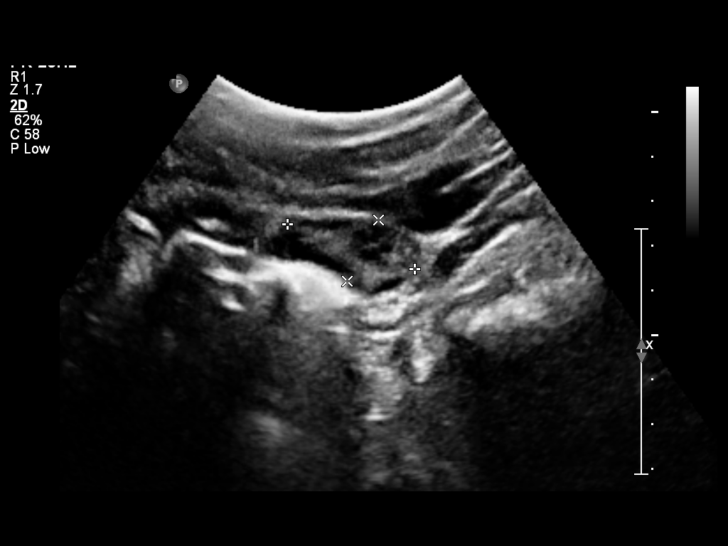
[im 68/77]
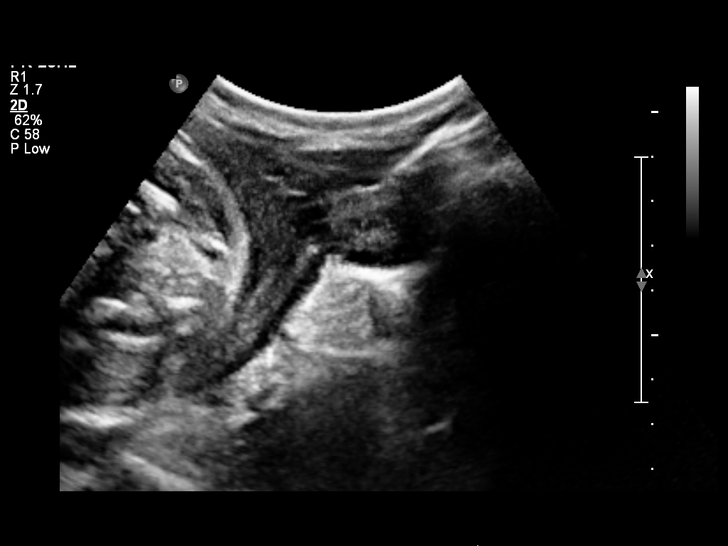
[im 74/77]
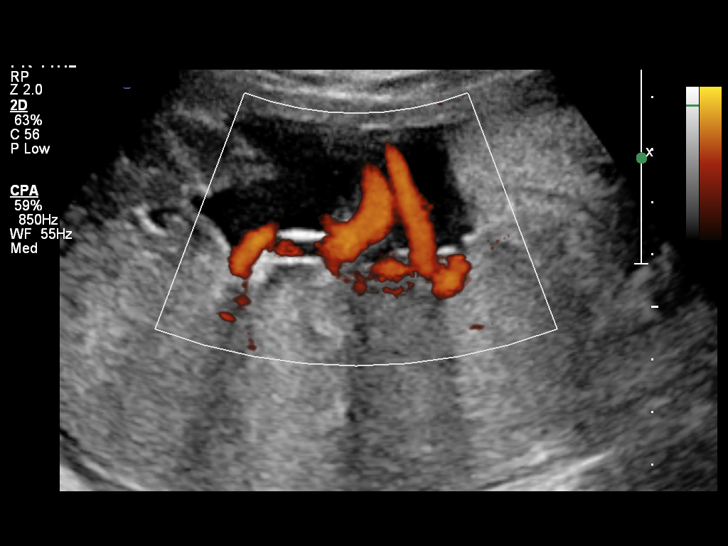

[12 of 28 positions shown; findings below may reference images not displayed]

OBSTETRICS REPORT
                      (Signed Final 12/08/2014 [DATE])

 Name:       MJ KOUYATE             Visit Date: 12/08/2014 [DATE]

Service(s) Provided

 US OB FOLLOW UP                                       76816.1
Indications

 Follow-up incomplete fetal anatomic evaluation        Z36
 30 weeks gestation of pregnancy
 Placental cyst
Fetal Evaluation

 Num Of Fetuses:    1
 Fetal Heart Rate:  143                          bpm
 Cardiac Activity:  Observed
 Presentation:      Cephalic
 Placenta:          Posterior,RT  above
                    cervical os
 P. Cord            Visualized, central
 Insertion:

 Amniotic Fluid
 AFI FV:      Subjectively within normal limits
 AFI Sum:     13.42   cm       42  %Tile     Larg Pckt:    5.13  cm
 RUQ:   3.22    cm   RLQ:    2.83   cm    LUQ:   2.24    cm   LLQ:    5.13   cm
Biometry

 BPD:     74.9  mm     G. Age:  30w 0d                CI:         72.5   70 - 86
                                                      FL/HC:      19.8   19.2 -

 HC:     279.8  mm     G. Age:  30w 5d       21  %    HC/AC:      1.09   0.99 -

 AC:     256.6  mm     G. Age:  29w 6d       28  %    FL/BPD:     74.0   71 - 87
 FL:      55.4  mm     G. Age:  29w 1d        9  %    FL/AC:      21.6   20 - 24
 HUM:       50  mm     G. Age:  29w 2d       29  %
 CER:     37.5  mm     G. Age:  32w 2d       73  %

 Est. FW:    0661  gm      3 lb 3 oz     39  %
Gestational Age

 LMP:           33w 4d        Date:  04/17/14                 EDD:   01/22/15
 U/S Today:     29w 6d                                        EDD:   02/17/15
 Best:          30w 3d     Det. By:  Early Exam  (06/24/14)   EDD:   02/13/15
Anatomy

 Cranium:          Appears normal         Aortic Arch:      Appears normal
 Fetal Cavum:      Appears normal         Ductal Arch:      Appears normal
 Ventricles:       Appears normal         Diaphragm:        Appears normal
 Choroid Plexus:   Appears normal         Stomach:          Appears normal, left
                                                            sided
 Cerebellum:       Appears normal         Abdomen:          Appears normal
 Posterior Fossa:  Appears normal         Abdominal Wall:   Not well visualized
 Nuchal Fold:      Not applicable (>20    Cord Vessels:     Appears normal (3
                   wks GA)                                  vessel cord)
 Face:             Orbits and profile     Kidneys:          Appear normal
                   previously seen
 Lips:             Previously seen        Bladder:          Appears normal
 Heart:            Appears normal         Spine:            Appears normal
                   (4CH, axis, and
                   situs)
 RVOT:             Appears normal         Lower             Previously seen
                                          Extremities:
 LVOT:             Appears normal         Upper             Previously seen
                                          Extremities:

 Other:  Fetus appears to be a female. Technically difficult due to fetal position.
Cervix Uterus Adnexa

 Cervical Length:    3.1      cm

 Cervix:       Normal appearance by transabdominal scan.

 Left Ovary:    Within normal limits.
 Right Ovary:   Within normal limits.
Impression

 Single IUP at 30w 3d
 A 3 x 1.4 x 1.5 cm placental cyst (on fetal aspect) again are
 noted near the placental cord insertion
 Interval anatomy is otherwise within normal limits
 Fetal growth is appropriate (39th %tile)
 Right, posterior placenta without previa
 Normal amniotic fluid volume
Recommendations

 While placental cyst are usually considered a benign finding,
 due to it's close proximity to the cord insertion, would
 recommend follow up ultrasound in 4 weeks for interval
 growth.

## 2016-10-21 ENCOUNTER — Encounter (HOSPITAL_COMMUNITY): Payer: Self-pay

## 2016-10-21 ENCOUNTER — Emergency Department (HOSPITAL_COMMUNITY)
Admission: EM | Admit: 2016-10-21 | Discharge: 2016-10-21 | Disposition: A | Discharge status: Home / Self Care | Payer: Medicaid Other | Attending: Emergency Medicine | Admitting: Emergency Medicine

## 2016-10-21 DIAGNOSIS — Z79899 Other long term (current) drug therapy: Secondary | ICD-10-CM | POA: Diagnosis not present

## 2016-10-21 DIAGNOSIS — H9203 Otalgia, bilateral: Secondary | ICD-10-CM | POA: Diagnosis not present

## 2016-10-21 DIAGNOSIS — Z87891 Personal history of nicotine dependence: Secondary | ICD-10-CM | POA: Diagnosis not present

## 2016-10-21 DIAGNOSIS — M26602 Left temporomandibular joint disorder, unspecified: Secondary | ICD-10-CM | POA: Insufficient documentation

## 2016-10-21 DIAGNOSIS — R29898 Other symptoms and signs involving the musculoskeletal system: Secondary | ICD-10-CM

## 2016-10-21 MED ORDER — PSEUDOEPHEDRINE HCL 30 MG PO TABS: 30.0000 mg | ORAL_TABLET | Freq: Four times a day (QID) | ORAL | 0 refills | Status: DC | PRN

## 2016-10-21 MED ORDER — NAPROXEN 375 MG PO TABS: 375.0000 mg | ORAL_TABLET | Freq: Two times a day (BID) | ORAL | 0 refills | Status: DC

## 2016-10-21 NOTE — ED Triage Notes (Signed)
Pt endorses bilateral ear pain with nasal drainage that began earlier today. Pt states "It started in my left ear and now it's in my right ear" Clear nasal drainage, denies fever.

## 2016-10-21 NOTE — ED Provider Notes (Signed)
[facility]-EMERGENCY DEPT Provider Note   CSN: [id number] Arrival date & time: 10/21/16  1858 By signing my name below, I, Bridgette HabermannMaria Tan, attest that this documentation has been prepared under the direction and in the presence of [facility] Neese, [place]. Electronically Signed: Bridgette HabermannMaria Tan, ED Scribe. 10/21/16. 8:18 PM.  History   Chief Complaint Chief Complaint  Patient presents with  . Otalgia   HPI Comments: Karina [name] is a 20 y.o. female with no pertinent PMHx, who presents to the Emergency Department complaining of bilateral ear pain onset ~3pm today with associated nasal congestion. She has not tried any OTC medications PTA. No known sick contacts with similar symptoms. Pt denies fever, chills, nausea, vomiting, diarrhea, sore throat, or any other associated symptoms.   The history is provided by the patient. No language interpreter was used.    Past Medical History:  Diagnosis Date  . Medical history non-contributory     Patient Active Problem List   Diagnosis Date Noted  . 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 Vaginal delivery 01/15/2015  . Screening, antenatal for fetal growth retardation with ultrasound   . Immature abnormal placenta   . Evaluate anatomy not seen on prior sonogram     Past Surgical History:  Procedure Laterality Date  . NO PAST SURGERIES      OB History    Gravida Para Term Preterm AB Living   1 1   1   1    SAB TAB Ectopic Multiple Live Births         0 1       Home Medications    Prior to Admission medications   Medication Sig Start Date End Date Taking? Authorizing Provider  acetaminophen (TYLENOL) 325 MG tablet Take 2 tablets (650 mg total) by mouth every 4 (four) hours as needed (for pain scale < 4ORtemperature>/=100.5 F). 01/18/15   Bluford MainAmber Henson, MD  acyclovir (ZOVIRAX) 400 MG tablet Take 1 tablet (400 mg total) by mouth 5 (five) times daily. 01/18/15   Bluford MainAmber Henson, MD  ibuprofen (ADVIL,MOTRIN) 600 MG tablet Take 1 tablet  (600 mg total) by mouth every 6 (six) hours. 01/18/15   Bluford MainAmber Henson, MD  naproxen (NAPROSYN) 375 MG tablet Take 1 tablet (375 mg total) by mouth 2 (two) times daily. 10/21/16   Hope Orlene OchM Neese, NP  NIFEdipine (PROCARDIA XL) 30 MG 24 hr tablet Take 1 tablet (30 mg total) by mouth 2 (two) times daily. Patient not taking: Reported on 01/16/2015 10/25/14   Rhona RaiderJacob J Stinson, DO  PRENATAL 28-0.8 MG TABS Take 1 tablet by mouth daily. 06/23/14   Carmelina DaneJeffery S Anderson, MD  pseudoephedrine (SUDAFED) 30 MG tablet Take 1 tablet (30 mg total) by mouth every 6 (six) hours as needed for congestion. 10/21/16   Hope Orlene OchM Neese, NP  ursodiol (ACTIGALL) 500 MG tablet Take 500 mg by mouth 2 (two) times daily.    Historical Provider, MD    Family History Family History  Problem Relation Age of Onset  . Hyperlipidemia Father     Social History Social History  Substance Use Topics  . Smoking status: Former Games developermoker  . Smokeless tobacco: Never Used  . Alcohol use No     Allergies   Patient has no known allergies.   Review of Systems Review of Systems  Constitutional: Negative for chills and fever.  HENT: Positive for congestion and ear pain. Negative for sore throat.   Gastrointestinal: Negative for diarrhea, nausea and vomiting.  All other  systems reviewed and are negative.    Physical Exam Updated Vital Signs BP 125/76 (BP Location: Left Arm)   Pulse 96   Temp 98.5 F (36.9 C) (Oral)   Resp 18   Ht 5\' 1"  (1.549 m)   Wt 34.9 kg   LMP 03/11/2016   SpO2 100%   BMI 14.55 kg/m   Physical Exam  Constitutional: She is oriented to person, place, and time. She appears well-developed and well-nourished. No distress.  HENT:  Head: Normocephalic and atraumatic.  Right Ear: Tympanic membrane normal.  Mouth/Throat: Oropharynx is clear and moist and mucous membranes are normal. No trismus in the jaw.  Dull-appearing left TM. Tender over left TMJ with opening and closing mouth.   Eyes: Conjunctivae are normal.    Neck: Neck supple.  Cardiovascular: Normal rate and regular rhythm.   Pulmonary/Chest: Effort normal and breath sounds normal.  Abdominal: She exhibits no distension.  Musculoskeletal: Normal range of motion.  Neurological: She is alert and oriented to person, place, and time.  Skin: Skin is warm and dry.  Psychiatric: She has a normal mood and affect. Her behavior is normal.  Nursing note and vitals reviewed.   ED Treatments / Results  DIAGNOSTIC STUDIES: Oxygen Saturation is 100% on RA, normal by my interpretation.    COORDINATION OF CARE: 8:18 PM Discussed treatment plan with pt at bedside and pt agreed to plan.   Labs (all labs ordered are listed, but only abnormal results are displayed) Labs Reviewed - No data to display   Radiology No results found.  Procedures Procedures (including critical care time)  Medications Ordered in ED Medications - No data to display   Initial Impression / Assessment and Plan / ED Course  I have reviewed the triage vital signs and the nursing notes.  Pertinent labs & imaging results that were available during my care of the patient were reviewed by me and considered in my medical decision making (see chart for details).  Clinical Course     8:28 PM When asking patient to open her mouth, there is a clicking noise to the right TMJ. Will treat for inflammation and patient encouraged to follow up with her dentist for further evaluation. I will prescribe muscle relaxant and decongestants.  Final Clinical Impressions(s) / ED Diagnoses   Final diagnoses:  Otalgia of both ears  TMJ click     I personally performed the services described in this documentation, which was scribed in my presence. The recorded information has been reviewed and is accurate.     [address]Hope HaysM Neese, [place]NP 10/24/16 2211    Loren Raceravid Yelverton, MD 10/26/16 [phone]

## 2018-09-08 ENCOUNTER — Encounter: Payer: Self-pay | Admitting: Emergency Medicine

## 2018-09-08 ENCOUNTER — Ambulatory Visit: Payer: Self-pay | Admitting: Emergency Medicine

## 2018-09-08 VITALS — BP 108/70 | HR 82 | Temp 99.2°F | Resp 16 | Ht 61.25 in | Wt 83.2 lb

## 2018-09-08 DIAGNOSIS — N644 Mastodynia: Secondary | ICD-10-CM

## 2018-09-08 NOTE — Patient Instructions (Addendum)
   If you have lab work done today you will be contacted with your lab results within the next 2 weeks.  If you have not heard from us then please contact us. The fastest way to get your results is to register for My Chart.   IF you received an x-ray today, you will receive an invoice from [facility]. Please contact [facility] at [phone] with questions or concerns regarding your invoice.   IF you received labwork today, you will receive an invoice from [facility]. Please contact [facility] at [phone] with questions or concerns regarding your invoice.   Our billing staff will not be able to assist you with questions regarding bills from these companies.  You will be contacted with the lab results as soon as they are available. The fastest way to get your results is to activate your My Chart account. Instructions are located on the last page of this paperwork. If you have not heard from us regarding the results in 2 weeks, please contact this office.     Breast Tenderness Breast tenderness is a common problem for women of all ages. Breast tenderness may cause mild discomfort to severe pain. The pain usually comes and goes in association with your menstrual cycle, but it can be constant. Breast tenderness has many possible causes, including hormone changes and some medicines. Your health care provider may order tests, such as a mammogram or an ultrasound, to check for any unusual findings. Having breast tenderness usually does not mean that you have breast cancer. Follow these instructions at home: Sometimes, reassurance that you do not have breast cancer is all that is needed. In general, follow these home care instructions: Managing pain and discomfort  If directed, apply ice to the area: ? Put ice in a plastic bag. ? Place a towel between your skin and the bag. ? Leave the ice on for 20 minutes, 2-3 times a day.  Make sure you are wearing a supportive bra,  especially during exercise. You may also want to wear a supportive bra while sleeping if your breasts are very tender. Medicines  Take over-the-counter and prescription medicines only as told by your health care provider. If the cause of your pain is infection, you may be prescribed an antibiotic medicine.  If you were prescribed an antibiotic, take it as told by your health care provider. Do not stop taking the antibiotic even if you start to feel better. General instructions  Your health care provider may recommend that you reduce the amount of fat in your diet. You can do this by: ? Limiting fried foods. ? Cooking foods using methods, such as baking, boiling, grilling, and broiling.  Decrease the amount of caffeine in your diet. You can do this by drinking more water and choosing caffeine-free options.  Keep a log of the days and times when your breasts are most tender.  Ask your health care provider how to do breast exams at home. This will help you notice if you have an unusual growth or lump. Contact a health care provider if:  Any part of your breast is hard, red, and hot to the touch. This may be a sign of infection.  You are not breastfeeding and you have fluid, especially blood or pus, coming out of your nipples.  You have a fever.  You have a new or painful lump in your breast that remains after your menstrual period ends.  Your pain does not improve or it gets   worse.  Your pain is interfering with your daily activities. This information is not intended to replace advice given to you by your health care provider. Make sure you discuss any questions you have with your health care provider. Document Released: 10/10/2008 Document Revised: 07/26/2016 Document Reviewed: 07/26/2016 Elsevier Interactive Patient Education  2018 Elsevier Inc.  

## 2018-09-08 NOTE — Progress Notes (Signed)
Karina [name] 22 y.o.   Chief Complaint  Patient presents with  . Breast Pain    right x3 days  . Establish Care    HISTORY OF PRESENT ILLNESS: This is a 22 y.o. female complaining of right breast that started 3 days ago.  Denies injury.  Denies nipple discharge.  Has had similar episodes before that lasted several minutes, never this long.  Denies any other significant symptoms. Healthy woman with no chronic medical problems. Just got off Depo shots 2 months ago so period has been somewhat irregular.  HPI   Prior to Admission medications   Medication Sig Start Date End Date Taking? Authorizing Provider  acetaminophen (TYLENOL) 325 MG tablet Take 2 tablets (650 mg total) by mouth every 4 (four) hours as needed (for pain scale < 4ORtemperature>/=100.5 F). Patient not taking: Reported on 09/08/2018 01/18/15   Bluford Main, MD  acyclovir (ZOVIRAX) 400 MG tablet Take 1 tablet (400 mg total) by mouth 5 (five) times daily. Patient not taking: Reported on 09/08/2018 01/18/15   Bluford Main, MD  ibuprofen (ADVIL,MOTRIN) 600 MG tablet Take 1 tablet (600 mg total) by mouth every 6 (six) hours. Patient not taking: Reported on 09/08/2018 01/18/15   Bluford Main, MD  naproxen (NAPROSYN) 375 MG tablet Take 1 tablet (375 mg total) by mouth 2 (two) times daily. Patient not taking: Reported on 09/08/2018 10/21/16   Janne Napoleon, NP  NIFEdipine (PROCARDIA XL) 30 MG 24 hr tablet Take 1 tablet (30 mg total) by mouth 2 (two) times daily. Patient not taking: Reported on 01/16/2015 10/25/14   Levie Heritage, DO  PRENATAL 28-0.8 MG TABS Take 1 tablet by mouth daily. Patient not taking: Reported on 09/08/2018 06/23/14   Carmelina Dane, MD  pseudoephedrine (SUDAFED) 30 MG tablet Take 1 tablet (30 mg total) by mouth every 6 (six) hours as needed for congestion. Patient not taking: Reported on 09/08/2018 10/21/16   Janne Napoleon, NP  ursodiol (ACTIGALL) 500 MG tablet Take 500 mg by mouth 2  (two) times daily.    [provider]    No Known Allergies  Patient Active Problem List   Diagnosis Date Noted  . 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 Vaginal delivery 01/15/2015  . Screening, antenatal for fetal growth retardation with ultrasound   . Immature abnormal placenta   . Evaluate anatomy not seen on prior sonogram     Past Medical History:  Diagnosis Date  . Medical history non-contributory     Past Surgical History:  Procedure Laterality Date  . NO PAST SURGERIES      Social History   Socioeconomic History  . Marital status: Single    Spouse name: Not on file  . Number of children: Not on file  . Years of education: Not on file  . Highest education level: Not on file  Occupational History  . Not on file  Social Needs  . Financial resource strain: Not on file  . Food insecurity:    Worry: Not on file    Inability: Not on file  . Transportation needs:    Medical: Not on file    Non-medical: Not on file  Tobacco Use  . Smoking status: Former Games developer  . Smokeless tobacco: Never Used  Substance and Sexual Activity  . Alcohol use: No  . Drug use: No  . Sexual activity: Yes    Birth control/protection: None  Lifestyle  . Physical activity:    Days  per week: Not on file    Minutes per session: Not on file  . Stress: Not on file  Relationships  . Social connections:    Talks on phone: Not on file    Gets together: Not on file    Attends religious service: Not on file    Active member of club or organization: Not on file    Attends meetings of clubs or organizations: Not on file    Relationship status: Not on file  . Intimate partner violence:    Fear of current or ex partner: Not on file    Emotionally abused: Not on file    Physically abused: Not on file    Forced sexual activity: Not on file  Other Topics Concern  . Not on file  Social History Narrative  . Not on file    Family History  Problem Relation Age of  Onset  . Hyperlipidemia Father      Review of Systems  Constitutional: Negative.  Negative for chills and fever.  HENT: Negative.   Eyes: Negative.   Respiratory: Negative for cough and shortness of breath.   Cardiovascular: Negative.  Negative for chest pain and palpitations.  Gastrointestinal: Negative for abdominal pain, nausea and vomiting.  Skin: Negative.  Negative for rash.  Neurological: Negative for dizziness and headaches.  All other systems reviewed and are negative.   Vitals:   09/08/18 1001  BP: 108/70  Pulse: 82  Resp: 16  Temp: 99.2 F (37.3 C)  SpO2: 99%    Physical Exam  Constitutional: She is oriented to person, place, and time. She appears well-developed and well-nourished.  HENT:  Head: Normocephalic and atraumatic.  Eyes: Pupils are equal, round, and reactive to light. EOM are normal.  Neck: Normal range of motion. Neck supple.  Cardiovascular: Normal rate and regular rhythm.  Pulmonary/Chest: Effort normal and breath sounds normal. She exhibits no mass and no tenderness. Right breast exhibits no inverted nipple, no mass, no nipple discharge, no skin change and no tenderness. Left breast exhibits no inverted nipple, no mass, no nipple discharge, no skin change and no tenderness.  Musculoskeletal: Normal range of motion.  Neurological: She is alert and oriented to person, place, and time.  Skin: Skin is warm and dry. Capillary refill takes less than 2 seconds.  Psychiatric: She has a normal mood and affect. Her behavior is normal.  Vitals reviewed.    ASSESSMENT & PLAN: Karina [name] was seen today for breast pain and establish care.  Diagnoses and all orders for this visit:  Breast pain -     Ambulatory referral to Obstetrics / Gynecology -     MM Digital Screening; Future -     [place] Unlisted Procedure Breast; Future    Patient Instructions       If you have lab work done today you will be contacted with your lab results within the next 2  weeks.  If you have not heard from [place] then please contact us. The fastest way to get your results is to register for My Chart.   IF you received an x-ray today, you will receive an invoice from [facility]. Please contact [facility] at [phone] with questions or concerns regarding your invoice.   IF you received labwork today, you will receive an invoice from [place] [facility]. Please contact [facility] at [phone] with questions or concerns regarding your invoice.   Our billing staff will not be able to assist you with questions regarding bills from these  companies.  You will be contacted with the lab results as soon as they are available. The fastest way to get your results is to activate your My Chart account. Instructions are located on the last page of this paperwork. If you have not heard from [place] regarding the results in 2 weeks, please contact this office.     Breast Tenderness Breast tenderness is a common problem for women of all ages. Breast tenderness may cause mild discomfort to severe pain. The pain usually comes and goes in association with your menstrual cycle, but it can be constant. Breast tenderness has many possible causes, including hormone changes and some medicines. Your health care provider may order tests, such as a mammogram or an ultrasound, to check for any unusual findings. Having breast tenderness usually does not mean that you have breast cancer. Follow these instructions at home: Sometimes, reassurance that you do not have breast cancer is all that is needed. In general, follow these home care instructions: Managing pain and discomfort  If directed, apply ice to the area: ? Put ice in a plastic bag. ? Place a towel between your skin and the bag. ? Leave the ice on for 20 minutes, 2-3 times a day.  Make sure you are wearing a supportive bra, especially during exercise. You may also want to wear a supportive bra while sleeping if your breasts are  very tender. Medicines  Take over-the-counter and prescription medicines only as told by your health care provider. If the cause of your pain is infection, you may be prescribed an antibiotic medicine.  If you were prescribed an antibiotic, take it as told by your health care provider. Do not stop taking the antibiotic even if you start to feel better. General instructions  Your health care provider may recommend that you reduce the amount of fat in your diet. You can do this by: ? Limiting fried foods. ? Cooking foods using methods, such as baking, boiling, grilling, and broiling.  Decrease the amount of caffeine in your diet. You can do this by drinking more water and choosing caffeine-free options.  Keep a log of the days and times when your breasts are most tender.  Ask your health care provider how to do breast exams at home. This will help you notice if you have an unusual growth or lump. Contact a health care provider if:  Any part of your breast is hard, red, and hot to the touch. This may be a sign of infection.  You are not breastfeeding and you have fluid, especially blood or pus, coming out of your nipples.  You have a fever.  You have a new or painful lump in your breast that remains after your menstrual period ends.  Your pain does not improve or it gets worse.  Your pain is interfering with your daily activities. This information is not intended to replace advice given to you by your health care provider. Make sure you discuss any questions you have with your health care provider. Document Released: 10/10/2008 Document Revised: 07/26/2016 Document Reviewed: 07/26/2016 Elsevier Interactive Patient Education  2018 [employer].      Edwina Barth, MD [facility]

## 2018-10-01 ENCOUNTER — Encounter: Payer: BLUE CROSS/BLUE SHIELD | Admitting: Obstetrics and Gynecology

## 2020-09-15 ENCOUNTER — Inpatient Hospital Stay (HOSPITAL_COMMUNITY): Payer: Medicaid Other

## 2020-09-15 ENCOUNTER — Other Ambulatory Visit: Payer: Self-pay

## 2020-09-15 ENCOUNTER — Inpatient Hospital Stay (HOSPITAL_COMMUNITY)
Admission: AD | Admit: 2020-09-15 | Discharge: 2020-09-15 | Disposition: A | Discharge status: Home / Self Care | Payer: Medicaid Other | Attending: Obstetrics and Gynecology | Admitting: Obstetrics and Gynecology

## 2020-09-15 ENCOUNTER — Encounter (HOSPITAL_COMMUNITY): Payer: Self-pay | Admitting: Obstetrics and Gynecology

## 2020-09-15 DIAGNOSIS — O208 Other hemorrhage in early pregnancy: Secondary | ICD-10-CM | POA: Insufficient documentation

## 2020-09-15 DIAGNOSIS — R109 Unspecified abdominal pain: Secondary | ICD-10-CM

## 2020-09-15 DIAGNOSIS — O26891 Other specified pregnancy related conditions, first trimester: Secondary | ICD-10-CM

## 2020-09-15 DIAGNOSIS — O468X1 Other antepartum hemorrhage, first trimester: Secondary | ICD-10-CM

## 2020-09-15 DIAGNOSIS — Z3A10 10 weeks gestation of pregnancy: Secondary | ICD-10-CM | POA: Diagnosis not present

## 2020-09-15 DIAGNOSIS — O418X10 Other specified disorders of amniotic fluid and membranes, first trimester, not applicable or unspecified: Secondary | ICD-10-CM

## 2020-09-15 DIAGNOSIS — O26899 Other specified pregnancy related conditions, unspecified trimester: Secondary | ICD-10-CM

## 2020-09-15 DIAGNOSIS — Z79899 Other long term (current) drug therapy: Secondary | ICD-10-CM | POA: Insufficient documentation

## 2020-09-15 LAB — CBC WITH DIFFERENTIAL/PLATELET
Abs Immature Granulocytes: 0.01 10*3/uL (ref 0.00–0.07)
Basophils Absolute: 0 10*3/uL (ref 0.0–0.1)
Basophils Relative: 0 %
Eosinophils Absolute: 0 10*3/uL (ref 0.0–0.5)
Eosinophils Relative: 1 %
HCT: 35.6 % — ABNORMAL LOW (ref 36.0–46.0)
Hemoglobin: 12.1 g/dL (ref 12.0–15.0)
Immature Granulocytes: 0 %
Lymphocytes Relative: 17 %
Lymphs Abs: 1.2 10*3/uL (ref 0.7–4.0)
MCH: 29.2 pg (ref 26.0–34.0)
MCHC: 34 g/dL (ref 30.0–36.0)
MCV: 86 fL (ref 80.0–100.0)
Monocytes Absolute: 0.4 10*3/uL (ref 0.1–1.0)
Monocytes Relative: 6 %
Neutro Abs: 5.3 10*3/uL (ref 1.7–7.7)
Neutrophils Relative %: 76 %
Platelets: 210 10*3/uL (ref 150–400)
RBC: 4.14 MIL/uL (ref 3.87–5.11)
RDW: 13.2 % (ref 11.5–15.5)
WBC: 6.9 10*3/uL (ref 4.0–10.5)
nRBC: 0 % (ref 0.0–0.2)

## 2020-09-15 LAB — COMPREHENSIVE METABOLIC PANEL
ALT: 21 U/L (ref 0–44)
AST: 21 U/L (ref 15–41)
Albumin: 4 g/dL (ref 3.5–5.0)
Alkaline Phosphatase: 28 U/L — ABNORMAL LOW (ref 38–126)
Anion gap: 9 (ref 5–15)
BUN: 8 mg/dL (ref 6–20)
CO2: 21 mmol/L — ABNORMAL LOW (ref 22–32)
Calcium: 9.2 mg/dL (ref 8.9–10.3)
Chloride: 107 mmol/L (ref 98–111)
Creatinine, Ser: 0.42 mg/dL — ABNORMAL LOW (ref 0.44–1.00)
GFR, Estimated: >60 mL/min (ref >60)
Glucose, Bld: 105 mg/dL — ABNORMAL HIGH (ref 70–99)
Potassium: 3.7 mmol/L (ref 3.5–5.1)
Sodium: 137 mmol/L (ref 135–145)
Total Bilirubin: 0.5 mg/dL (ref 0.3–1.2)
Total Protein: 6.9 g/dL (ref 6.5–8.1)

## 2020-09-15 LAB — URINALYSIS, ROUTINE W REFLEX MICROSCOPIC
Bilirubin Urine: NEGATIVE
Glucose, UA: 150 mg/dL — AB
Hgb urine dipstick: NEGATIVE
Ketones, ur: NEGATIVE mg/dL
Leukocytes,Ua: NEGATIVE
Nitrite: NEGATIVE
Protein, ur: NEGATIVE mg/dL
Specific Gravity, Urine: 1.02 (ref 1.005–1.030)
pH: 6 (ref 5.0–8.0)

## 2020-09-15 LAB — HIV ANTIBODY (ROUTINE TESTING W REFLEX): HIV Screen 4th Generation wRfx: NONREACTIVE

## 2020-09-15 LAB — HCG, QUANTITATIVE, PREGNANCY: hCG, Beta Chain, Quant, S: 166607 m[IU]/mL — ABNORMAL HIGH (ref <5)

## 2020-09-15 LAB — POCT PREGNANCY, URINE: Preg Test, Ur: POSITIVE — AB

## 2020-09-15 MED ORDER — IBUPROFEN 600 MG PO TABS
600.0000 mg | ORAL_TABLET | Freq: Once | ORAL | Status: AC
  Administered 2020-09-15: 600 mg via ORAL
  Filled 2020-09-15: qty 1

## 2020-09-15 NOTE — MAU Provider Note (Signed)
History     CSN: [id number]  Arrival date and time: 09/15/20 2426   First Provider Initiated Contact with Patient 09/15/20 0900      Chief Complaint  Patient presents with  . Abdominal Pain  . Back Pain   HPI  Karina [name] is a 24 y.o. female [id number] @ [redacted]w[redacted]d here in MAU with complaints of abdominal pain. The pain started as back pain on Wednesday and today the pain has moved to her lower abdomen. The pain did keep her awake last night. She has not taken anything for the pain. The pain comes and goes. The pain is all across her lower abdomen. She reports no vaginal bleeding.   OB History    Gravida  2   Para  1   Term      Preterm  1   AB      Living  1     SAB      TAB      Ectopic      Multiple  0   Live Births  1           Past Medical History:  Diagnosis Date  . Preterm labor     Past Surgical History:  Procedure Laterality Date  . NO PAST SURGERIES      Family History  Problem Relation Age of Onset  . Healthy Mother   . Hyperlipidemia Father   . Diabetes Father     Social History   Tobacco Use  . Smoking status: Never Smoker  . Smokeless tobacco: Never Used  Vaping Use  . Vaping Use: Never used  Substance Use Topics  . Alcohol use: No  . Drug use: No    Types: Marijuana    Comment: prior to first preg    Allergies: No Known Allergies  Medications Prior to Admission  Medication Sig Dispense Refill Last Dose  . PRENATAL 28-0.8 MG TABS Take 1 tablet by mouth daily. 30 each 12 09/14/2020 at Unknown time  . acetaminophen (TYLENOL) 325 MG tablet Take 2 tablets (650 mg total) by mouth every 4 (four) hours as needed (for pain scale < 4ORtemperature>/=100.5 F). (Patient not taking: Reported on 09/08/2018) 30 tablet 0   . acyclovir (ZOVIRAX) 400 MG tablet Take 1 tablet (400 mg total) by mouth 5 (five) times daily. (Patient not taking: Reported on 09/08/2018) 50 tablet 0   . ibuprofen (ADVIL,MOTRIN) 600 MG tablet Take 1  tablet (600 mg total) by mouth every 6 (six) hours. (Patient not taking: Reported on 09/08/2018) 30 tablet 0   . naproxen (NAPROSYN) 375 MG tablet Take 1 tablet (375 mg total) by mouth 2 (two) times daily. (Patient not taking: Reported on 09/08/2018) 20 tablet 0   . NIFEdipine (PROCARDIA XL) 30 MG 24 hr tablet Take 1 tablet (30 mg total) by mouth 2 (two) times daily. (Patient not taking: Reported on 01/16/2015) 60 tablet 3   . pseudoephedrine (SUDAFED) 30 MG tablet Take 1 tablet (30 mg total) by mouth every 6 (six) hours as needed for congestion. (Patient not taking: Reported on 09/08/2018) 30 tablet 0   . ursodiol (ACTIGALL) 500 MG tablet Take 500 mg by mouth 2 (two) times daily.      Results for orders placed or performed during the hospital encounter of 09/15/20 (from the past 48 hour(s))  Pregnancy, urine POC     Status: Abnormal   Collection Time: 09/15/20  8:37 AM  Result Value Ref Range   Preg Test,  Ur POSITIVE (A) NEGATIVE    Comment:        THE SENSITIVITY OF THIS METHODOLOGY IS >24 mIU/mL   Urinalysis, Routine w reflex microscopic Urine, Clean Catch     Status: Abnormal   Collection Time: 09/15/20  8:42 AM  Result Value Ref Range   Color, Urine YELLOW YELLOW   APPearance HAZY (A) CLEAR   Specific Gravity, Urine 1.020 1.005 - 1.030   pH 6.0 5.0 - 8.0   Glucose, UA 150 (A) NEGATIVE mg/dL   Hgb urine dipstick NEGATIVE NEGATIVE   Bilirubin Urine NEGATIVE NEGATIVE   Ketones, ur NEGATIVE NEGATIVE mg/dL   Protein, ur NEGATIVE NEGATIVE mg/dL   Nitrite NEGATIVE NEGATIVE   Leukocytes,Ua NEGATIVE NEGATIVE    Comment: Performed at [facility], [address], [place], [place] [address]  CBC with Differential/Platelet     Status: Abnormal   Collection Time: 09/15/20  9:17 AM  Result Value Ref Range   WBC 6.9 4.0 - 10.5 K/uL   RBC 4.14 3.87 - 5.11 MIL/uL   Hemoglobin 12.1 12.0 - 15.0 g/dL   HCT 14.9 (L) 36 - 46 %   MCV 86.0 80.0 - 100.0 fL   MCH 29.2 26.0 - 34.0 pg   MCHC 34.0  30.0 - 36.0 g/dL   RDW 70.2 63.7 - 85.8 %   Platelets 210 150 - 400 K/uL   nRBC 0.0 0.0 - 0.2 %   Neutrophils Relative % 76 %   Neutro Abs 5.3 1.7 - 7.7 K/uL   Lymphocytes Relative 17 %   Lymphs Abs 1.2 0.7 - 4.0 K/uL   Monocytes Relative 6 %   Monocytes Absolute 0.4 0.1 - 1.0 K/uL   Eosinophils Relative 1 %   Eosinophils Absolute 0.0 0.0 - 0.5 K/uL   Basophils Relative 0 %   Basophils Absolute 0.0 0.0 - 0.1 K/uL   Immature Granulocytes 0 %   Abs Immature Granulocytes 0.01 0.00 - 0.07 K/uL    Comment: Performed at [facility], [address] [place], [place] [address]  Comprehensive metabolic panel     Status: Abnormal   Collection Time: 09/15/20  9:17 AM  Result Value Ref Range   Sodium 137 135 - 145 mmol/L   Potassium 3.7 3.5 - 5.1 mmol/L   Chloride 107 98 - 111 mmol/L   CO2 21 (L) 22 - 32 mmol/L   Glucose, Bld 105 (H) 70 - 99 mg/dL    Comment: Glucose reference range applies only to samples taken after fasting for at least 8 hours.   BUN 8 6 - 20 mg/dL   Creatinine, Ser 7.41 (L) 0.44 - 1.00 mg/dL   Calcium 9.2 8.9 - 28.7 mg/dL   Total Protein 6.9 6.5 - 8.1 g/dL   Albumin 4.0 3.5 - 5.0 g/dL   AST 21 15 - 41 U/L   ALT 21 0 - 44 U/L   Alkaline Phosphatase 28 (L) 38 - 126 U/L   Total Bilirubin 0.5 0.3 - 1.2 mg/dL   GFR, Estimated >86 >76 mL/min    Comment: (NOTE) Calculated using the CKD-EPI Creatinine Equation (2021)    Anion gap 9 5 - 15    Comment: Performed at [facility], [address], [place], [place] [address]  hCG, quantitative, pregnancy     Status: Abnormal   Collection Time: 09/15/20  9:17 AM  Result Value Ref Range   hCG, Beta Chain, Quant, S 166,607 (H) <5 mIU/mL  Comment:          GEST. AGE      CONC.  (mIU/mL)   <=1 WEEK        5 - 50     2 WEEKS       50 - 500     3 WEEKS       100 - 10,000     4 WEEKS     1,000 - 30,000     5 WEEKS     3,500 - 115,000   6-8 WEEKS     12,000 - 270,000    12 WEEKS     15,000 - 220,000        FEMALE  AND NON-PREGNANT FEMALE:     LESS THAN 5 mIU/mL Performed at [facility], [address] [place], [place] [address]   HIV Antibody (routine testing w rflx)     Status: None   Collection Time: 09/15/20  9:17 AM  Result Value Ref Range   HIV Screen 4th Generation wRfx Non Reactive Non Reactive    Comment: Performed at [facility], [address] [place], [place] [address]   US [place] Comp Less 14 Wks  Result Date: 09/15/2020 CLINICAL DATA:  Abdominal cramping in first trimester of pregnancy, lower abdominal pain, LMP 07/05/2020; quantitative beta HCG level pending EXAM: OBSTETRIC <14 WK ULTRASOUND TECHNIQUE: Transabdominal ultrasound was performed for evaluation of the gestation as well as the maternal uterus and adnexal regions. COMPARISON:  None FINDINGS: Intrauterine gestational sac: Present, single Yolk sac:  Present Embryo:  Present Cardiac Activity: Present Heart Rate: 166 bpm CRL:   31.9 mm   10 w 0 d                  US EDC: 04/13/2021 Subchorionic hemorrhage:  Small subchronic hemorrhage Maternal uterus/adnexae: Remainder of maternal uterus unremarkable. RIGHT ovary measures 4.2 x 1.8 x 2.9 cm and contains a small corpus luteum. LEFT ovary normal size and morphology, 1.9 x 1.3 x 1.5 cm. No free pelvic fluid or adnexal masses. IMPRESSION: Single live intrauterine gestation at 10 weeks 0 days EGA by crown-rump length. Small subchronic hemorrhage. Electronically Signed   By: Ulyses SouthwardMark  Boles M.D.   On: 09/15/2020 09:59   Review of Systems  Constitutional: Negative for fever.  Gastrointestinal: Positive for abdominal pain.   Physical Exam   Blood pressure 119/67, pulse (!) 111, temperature 98.5 F (36.9 C), temperature source Oral, resp. rate 16, height 5\' 1"  (1.549 m), weight 37.3 kg, last menstrual period 07/05/2020, SpO2 100 %, unknown if currently breastfeeding.  Physical Exam Constitutional:      General: She is not in acute distress.    Appearance: She is well-developed. She  is obese. She is not ill-appearing, toxic-appearing or diaphoretic.  HENT:     Head: Normocephalic.  Eyes:     Pupils: Pupils are equal, round, and reactive to light.  Abdominal:     Palpations: Abdomen is soft.     Tenderness: There is abdominal tenderness in the right lower quadrant, periumbilical area, suprapubic area and left lower quadrant. There is no guarding or rebound.     Hernia: No hernia is present.  Neurological:     Mental Status: She is alert and oriented to person, place, and time.    MAU Course  Procedures  None  MDM  A positive blood type Wet prep & GC HIV, CBC, Hcg, ABO US OB transvaginal  Pain improved with  600 mg of ibuprofen.  Assessment and Plan   A:  SIUP 1. Subchorionic hemorrhage of placenta in first trimester, single or unspecified fetus   2. Abdominal cramping affecting pregnancy   3. [redacted] weeks gestation of pregnancy     P:  Discharge home in stable condition Reviewed [place] results in detail with the patient  Bleeding precautions with subchorionic hemorrhage Ok to use tylenol as directed on the bottle No ibuprofen after 28 weeks Keep your OB appointment that is scheduled.   Duane Lope, NP 09/15/2020 7:16 PM
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
## 2020-09-15 NOTE — Discharge Instructions (Signed)
First Trimester of Pregnancy The first trimester of pregnancy is from week 1 until the end of week 13 (months 1 through 3). A week after a sperm fertilizes an egg, the egg will implant on the wall of the uterus. This embryo will begin to develop into a baby. Genes from you and your partner will form the baby. The female genes will determine whether the baby will be a boy or a girl. At 6-8 weeks, the eyes and face will be formed, and the heartbeat can be seen on ultrasound. At the end of 12 weeks, all the baby's organs will be formed. Now that you are pregnant, you will want to do everything you can to have a healthy baby. Two of the most important things are to get good prenatal care and to follow your health care provider's instructions. Prenatal care is all the medical care you receive before the baby's birth. This care will help prevent, find, and treat any problems during the pregnancy and childbirth. Body changes during your first trimester Your body goes through many changes during pregnancy. The changes vary from woman to woman.  You may gain or lose a couple of pounds at first.  You may feel sick to your stomach (nauseous) and you may throw up (vomit). If the vomiting is uncontrollable, call your health care provider.  You may tire easily.  You may develop headaches that can be relieved by medicines. All medicines should be approved by your health care provider.  You may urinate more often. Painful urination may mean you have a bladder infection.  You may develop heartburn as a result of your pregnancy.  You may develop constipation because certain hormones are causing the muscles that push stool through your intestines to slow down.  You may develop hemorrhoids or swollen veins (varicose veins).  Your breasts may begin to grow larger and become tender. Your nipples may stick out more, and the tissue that surrounds them (areola) may become darker.  Your gums may bleed and may be  sensitive to brushing and flossing.  Dark spots or blotches (chloasma, mask of pregnancy) may develop on your face. This will likely fade after the baby is born.  Your menstrual periods will stop.  You may have a loss of appetite.  You may develop cravings for certain kinds of food.  You may have changes in your emotions from day to day, such as being excited to be pregnant or being concerned that something may go wrong with the pregnancy and baby.  You may have more vivid and strange dreams.  You may have changes in your hair. These can include thickening of your hair, rapid growth, and changes in texture. Some women also have hair loss during or after pregnancy, or hair that feels dry or thin. Your hair will most likely return to normal after your baby is born. What to expect at prenatal visits During a routine prenatal visit:  You will be weighed to make sure you and the baby are growing normally.  Your blood pressure will be taken.  Your abdomen will be measured to track your baby's growth.  The fetal heartbeat will be listened to between weeks 10 and 14 of your pregnancy.  Test results from any previous visits will be discussed. Your health care provider may ask you:  How you are feeling.  If you are feeling the baby move.  If you have had any abnormal symptoms, such as leaking fluid, bleeding, severe headaches, or abdominal   cramping.  If you are using any tobacco products, including cigarettes, chewing tobacco, and electronic cigarettes.  If you have any questions. Other tests that may be performed during your first trimester include:  Blood tests to find your blood type and to check for the presence of any previous infections. The tests will also be used to check for low iron levels (anemia) and protein on red blood cells (Rh antibodies). Depending on your risk factors, or if you previously had diabetes during pregnancy, you may have tests to check for high blood sugar  that affects pregnant women (gestational diabetes).  Urine tests to check for infections, diabetes, or protein in the urine.  An ultrasound to confirm the proper growth and development of the baby.  Fetal screens for spinal cord problems (spina bifida) and Down syndrome.  HIV (human immunodeficiency virus) testing. Routine prenatal testing includes screening for HIV, unless you choose not to have this test.  You may need other tests to make sure you and the baby are doing well. Follow these instructions at home: Medicines  Follow your health care provider's instructions regarding medicine use. Specific medicines may be either safe or unsafe to take during pregnancy.  Take a prenatal vitamin that contains at least 600 micrograms (mcg) of folic acid.  If you develop constipation, try taking a stool softener if your health care provider approves. Eating and drinking   Eat a balanced diet that includes fresh fruits and vegetables, whole grains, good sources of protein such as meat, eggs, or tofu, and low-fat dairy. Your health care provider will help you determine the amount of weight gain that is right for you.  Avoid raw meat and uncooked cheese. These carry germs that can cause birth defects in the baby.  Eating four or five small meals rather than three large meals a day may help relieve nausea and vomiting. If you start to feel nauseous, eating a few soda crackers can be helpful. Drinking liquids between meals, instead of during meals, also seems to help ease nausea and vomiting.  Limit foods that are high in fat and processed sugars, such as fried and sweet foods.  To prevent constipation: ? Eat foods that are high in fiber, such as fresh fruits and vegetables, whole grains, and beans. ? Drink enough fluid to keep your urine clear or pale yellow. Activity  Exercise only as directed by your health care provider. Most women can continue their usual exercise routine during  pregnancy. Try to exercise for 30 minutes at least 5 days a week. Exercising will help you: ? Control your weight. ? Stay in shape. ? Be prepared for labor and delivery.  Experiencing pain or cramping in the lower abdomen or lower back is a good sign that you should stop exercising. Check with your health care provider before continuing with normal exercises.  Try to avoid standing for long periods of time. Move your legs often if you must stand in one place for a long time.  Avoid heavy lifting.  Wear low-heeled shoes and practice good posture.  You may continue to have sex unless your health care provider tells you not to. Relieving pain and discomfort  Wear a good support bra to relieve breast tenderness.  Take warm sitz baths to soothe any pain or discomfort caused by hemorrhoids. Use hemorrhoid cream if your health care provider approves.  Rest with your legs elevated if you have leg cramps or low back pain.  If you develop varicose veins in   your legs, wear support hose. Elevate your feet for 15 minutes, 3-4 times a day. Limit salt in your diet. Prenatal care  Schedule your prenatal visits by the twelfth week of pregnancy. They are usually scheduled monthly at first, then more often in the last 2 months before delivery.  Write down your questions. Take them to your prenatal visits.  Keep all your prenatal visits as told by your health care provider. This is important. Safety  Wear your seat belt at all times when driving.  Make a list of emergency phone numbers, including numbers for family, friends, the hospital, and police and fire departments. General instructions  Ask your health care provider for a referral to a local prenatal education class. Begin classes no later than the beginning of month 6 of your pregnancy.  Ask for help if you have counseling or nutritional needs during pregnancy. Your health care provider can offer advice or refer you to specialists for help  with various needs.  Do not use hot tubs, steam rooms, or saunas.  Do not douche or use tampons or scented sanitary pads.  Do not cross your legs for long periods of time.  Avoid cat litter boxes and soil used by cats. These carry germs that can cause birth defects in the baby and possibly loss of the fetus by miscarriage or stillbirth.  Avoid all smoking, herbs, alcohol, and medicines not prescribed by your health care provider. Chemicals in these products affect the formation and growth of the baby.  Do not use any products that contain nicotine or tobacco, such as cigarettes and e-cigarettes. If you need help quitting, ask your health care provider. You may receive counseling support and other resources to help you quit.  Schedule a dentist appointment. At home, brush your teeth with a soft toothbrush and be gentle when you floss. Contact a health care provider if:  You have dizziness.  You have mild pelvic cramps, pelvic pressure, or nagging pain in the abdominal area.  You have persistent nausea, vomiting, or diarrhea.  You have a bad smelling vaginal discharge.  You have pain when you urinate.  You notice increased swelling in your face, hands, legs, or ankles.  You are exposed to fifth disease or chickenpox.  You are exposed to [place] measles (rubella) and have never had it. Get help right away if:  You have a fever.  You are leaking fluid from your vagina.  You have spotting or bleeding from your vagina.  You have severe abdominal cramping or pain.  You have rapid weight gain or loss.  You vomit blood or material that looks like coffee grounds.  You develop a severe headache.  You have shortness of breath.  You have any kind of trauma, such as from a fall or a car accident. Summary  The first trimester of pregnancy is from week 1 until the end of week 13 (months 1 through 3).  Your body goes through many changes during pregnancy. The changes vary from  woman to woman.  You will have routine prenatal visits. During those visits, your health care provider will examine you, discuss any test results you may have, and talk with you about how you are feeling. This information is not intended to replace advice given to you by your health care provider. Make sure you discuss any questions you have with your health care provider. Document Revised: 10/10/2017 Document Reviewed: 10/09/2016 Elsevier Patient Education  2020 [employer]  Subchorionic Hematoma  A subchorionic  hematoma is a gathering of blood between the outer wall of the embryo (chorion) and the inner wall of the womb (uterus). This condition can cause vaginal bleeding. If they cause little or no vaginal bleeding, early small hematomas usually shrink on their own and do not affect your baby or pregnancy. When bleeding starts later in pregnancy, or if the hematoma is larger or occurs in older pregnant women, the condition may be more serious. Larger hematomas may get bigger, which increases the chances of miscarriage. This condition also increases the risk of:  Premature separation of the placenta from the uterus.  Premature (preterm) labor.  Stillbirth. What are the causes? The exact cause of this condition is not known. It occurs when blood is trapped between the placenta and the uterine wall because the placenta has separated from the original site of implantation. What increases the risk? You are more likely to develop this condition if:  You were treated with fertility medicines.  You conceived through in vitro fertilization (IVF). What are the signs or symptoms? Symptoms of this condition include:  Vaginal spotting or bleeding.  Contractions of the uterus. These cause abdominal pain. Sometimes you may have no symptoms and the bleeding may only be seen when ultrasound images are taken (transvaginal ultrasound). How is this diagnosed? This condition is diagnosed based on a  physical exam. This includes a pelvic exam. You may also have other tests, including:  Blood tests.  Urine tests.  Ultrasound of the abdomen. How is this treated? Treatment for this condition can vary. Treatment may include:  Watchful waiting. You will be monitored closely for any changes in bleeding. During this stage: ? The hematoma may be reabsorbed by the body. ? The hematoma may separate the fluid-filled space containing the embryo (gestational sac) from the wall of the womb (endometrium).  Medicines.  Activity restriction. This may be needed until the bleeding stops. Follow these instructions at home:  Stay on bed rest if told to do so by your health care provider.  Do not lift anything that is heavier than 10 lbs. (4.5 kg) or as told by your health care provider.  Do not use any products that contain nicotine or tobacco, such as cigarettes and e-cigarettes. If you need help quitting, ask your health care provider.  Track and write down the number of pads you use each day and how soaked (saturated) they are.  Do not use tampons.  Keep all follow-up visits as told by your health care provider. This is important. Your health care provider may ask you to have follow-up blood tests or ultrasound tests or both. Contact a health care provider if:  You have any vaginal bleeding.  You have a fever. Get help right away if:  You have severe cramps in your stomach, back, abdomen, or pelvis.  You pass large clots or tissue. Save any tissue for your health care provider to look at.  You have more vaginal bleeding, and you faint or become lightheaded or weak. Summary  A subchorionic hematoma is a gathering of blood between the outer wall of the placenta and the uterus.  This condition can cause vaginal bleeding.  Sometimes you may have no symptoms and the bleeding may only be seen when ultrasound images are taken.  Treatment may include watchful waiting, medicines, or  activity restriction. This information is not intended to replace advice given to you by your health care provider. Make sure you discuss any questions you have with your health  care provider. Document Revised: 10/10/2017 Document Reviewed: 12/24/2016 Elsevier Patient Education  2020 [employer].

## 2020-10-24 DIAGNOSIS — O0990 Supervision of high risk pregnancy, unspecified, unspecified trimester: Secondary | ICD-10-CM | POA: Insufficient documentation

## 2020-10-25 ENCOUNTER — Ambulatory Visit (INDEPENDENT_AMBULATORY_CARE_PROVIDER_SITE_OTHER): Payer: Medicaid Other | Admitting: Obstetrics & Gynecology

## 2020-10-25 ENCOUNTER — Other Ambulatory Visit: Payer: Self-pay

## 2020-10-25 ENCOUNTER — Encounter: Payer: Self-pay | Admitting: Obstetrics & Gynecology

## 2020-10-25 ENCOUNTER — Other Ambulatory Visit (HOSPITAL_COMMUNITY)
Admission: RE | Admit: 2020-10-25 | Discharge: 2020-10-25 | Disposition: A | Discharge status: Home / Self Care | Payer: Medicaid Other | Source: Ambulatory Visit | Attending: Obstetrics & Gynecology | Admitting: Obstetrics & Gynecology

## 2020-10-25 VITALS — BP 109/67 | HR 105 | Wt 88.8 lb

## 2020-10-25 DIAGNOSIS — Z3480 Encounter for supervision of other normal pregnancy, unspecified trimester: Secondary | ICD-10-CM | POA: Diagnosis present

## 2020-10-25 DIAGNOSIS — Z3482 Encounter for supervision of other normal pregnancy, second trimester: Secondary | ICD-10-CM

## 2020-10-25 DIAGNOSIS — Z8759 Personal history of other complications of pregnancy, childbirth and the puerperium: Secondary | ICD-10-CM

## 2020-10-25 DIAGNOSIS — Z23 Encounter for immunization: Secondary | ICD-10-CM

## 2020-10-25 DIAGNOSIS — Z3A16 16 weeks gestation of pregnancy: Secondary | ICD-10-CM | POA: Diagnosis not present

## 2020-10-25 DIAGNOSIS — O09899 Supervision of other high risk pregnancies, unspecified trimester: Secondary | ICD-10-CM

## 2020-10-25 DIAGNOSIS — Z8719 Personal history of other diseases of the digestive system: Secondary | ICD-10-CM

## 2020-10-25 MED ORDER — BLOOD PRESSURE KIT DEVI: 1.0000 | 0 refills | Status: DC

## 2020-10-25 NOTE — Progress Notes (Addendum)
New OB, reports no problems today.  FLU vaccine given in RD, tolerated well.  PHQ-9=0

## 2020-10-25 NOTE — Progress Notes (Signed)
  Subjective:h/o PTB 36 weeks    Karina [name] is a G2P0101 [redacted]w[redacted]d being seen today for her first obstetrical visit.  Her obstetrical history is significant for previous preterm delivery, cholestasis. Patient does intend to breast feed. Pregnancy history fully reviewed.  Patient reports no complaints.  Vitals:   10/25/20 1420  BP: 109/67  Pulse: (!) 105  Weight: 88 lb 12.8 oz (40.3 kg)    HISTORY: OB History  Gravida Para Term Preterm AB Living  2 1   1   1   SAB IAB Ectopic Multiple Live Births        0 1    # Outcome Date GA Lbr Len/2nd Weight Sex Delivery Anes PTL Lv  2 Current           1 Preterm 01/15/15 [redacted]w[redacted]d 05:18 / 02:59 4 lb 15.4 oz (2.25 kg) F Vag-Spont EPI Y LIV   Past Medical History:  Diagnosis Date  . Preterm labor    Past Surgical History:  Procedure Laterality Date  . NO PAST SURGERIES     Family History  Problem Relation Age of Onset  . Healthy Mother   . Hyperlipidemia Father   . Diabetes Father      Exam    Uterus:     Pelvic Exam:    Perineum: No Hemorrhoids   Vulva: normal   Vagina:  normal mucosa   pH:     Cervix: no lesions   Adnexa: normal adnexa   Bony Pelvis: average  System: Breast:  normal appearance, no masses or tenderness   Skin: normal coloration and turgor, no rashes    Neurologic: oriented, normal mood   Extremities: normal strength, tone, and muscle mass   HEENT neck supple with midline trachea and thyroid without masses   Mouth/Teeth mucous membranes moist, pharynx normal without lesions and dental hygiene good   Neck supple and no masses   Cardiovascular: regular rate and rhythm, no murmurs or gallops   Respiratory:  appears well, vitals normal, no respiratory distress, acyanotic, normal RR, neck free of mass or lymphadenopathy, chest clear, no wheezing, crepitations, rhonchi, normal symmetric air entry   Abdomen: soft, non-tender; bowel sounds normal; no masses,  no organomegaly   Urinary: urethral meatus normal       Assessment:    Pregnancy: G2P0101 Patient Active Problem List   Diagnosis Date Noted  . Supervision of other normal pregnancy, antepartum 10/24/2020  . 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 Vaginal delivery 01/15/2015  . Screening, antenatal for fetal growth retardation with ultrasound   . Immature abnormal placenta   . Evaluate anatomy not seen on prior sonogram         Plan:     Initial labs drawn. Prenatal vitamins. Problem list reviewed and updated. Genetic Screening discussed AFP and Panorama and Horizon.  Ultrasound discussed; fetal survey: ordered.  Follow up in 2 weeks. She was offered Makena 50% of 30 min visit spent on counseling and coordination of care.     03/17/2015 10/25/2020

## 2020-10-25 NOTE — Patient Instructions (Signed)
Preterm Labor and Birth Information Pregnancy normally lasts 39-41 weeks. Preterm labor is when labor starts early. It starts before you have been pregnant for 37 whole weeks. What are the risk factors for preterm labor? Preterm labor is more likely to occur in women who:  Have an infection while pregnant.  Have a cervix that is short.  Have gone into preterm labor before.  Have had surgery on their cervix.  Are younger than age 24.  Are older than age 21.  Are African American.  Are pregnant with two or more babies.  Take street drugs while pregnant.  Smoke while pregnant.  Do not gain enough weight while pregnant.  Got pregnant right after another pregnancy. What are the symptoms of preterm labor? Symptoms of preterm labor include:  Cramps. The cramps may feel like the cramps some women get during their period. The cramps may happen with watery poop (diarrhea).  Pain in the belly (abdomen).  Pain in the lower back.  Regular contractions or tightening. It may feel like your belly is getting tighter.  Pressure in the lower belly that seems to get stronger.  More fluid (discharge) leaking from the vagina. The fluid may be watery or bloody.  Water breaking. Why is it important to notice signs of preterm labor? Babies who are born early may not be fully developed. They have a higher chance for:  Long-term heart problems.  Long-term lung problems.  Trouble controlling body systems, like breathing.  Bleeding in the brain.  A condition called cerebral palsy.  Learning difficulties.  Death. These risks are highest for babies who are born before 34 weeks of pregnancy. How is preterm labor treated? Treatment depends on:  How long you were pregnant.  Your condition.  The health of your baby. Treatment may involve:  Having a stitch (suture) placed in your cervix. When you give birth, your cervix opens so the baby can come out. The stitch keeps the cervix  from opening too soon.  Staying at the hospital.  Taking or getting medicines, such as: ? Hormone medicines. ? Medicines to stop contractions. ? Medicines to help the babys lungs develop. ? Medicines to prevent your baby from having cerebral palsy. What should I do if I am in preterm labor? If you think you are going into labor too soon, call your doctor right away. How can I prevent preterm labor?  Do not use any tobacco products. ? Examples of these are cigarettes, chewing tobacco, and e-cigarettes. ? If you need help quitting, ask your doctor.  Do not use street drugs.  Do not use any medicines unless you ask your doctor if they are safe for you.  Talk with your doctor before taking any herbal supplements.  Make sure you gain enough weight.  Watch for infection. If you think you might have an infection, get it checked right away.  If you have gone into preterm labor before, tell your doctor. This information is not intended to replace advice given to you by your health care provider. Make sure you discuss any questions you have with your health care provider. Document Revised: 02/19/2019 Document Reviewed: 03/20/2016 Elsevier Patient Education  2020 [employer]. Second Trimester of Pregnancy  The second trimester is from week 14 through week 27 (month 4 through 6). This is often the time in pregnancy that you feel your best. Often times, morning sickness has lessened or quit. You may have more energy, and you may get hungry more often. Your unborn  baby is growing rapidly. At the end of the sixth month, he or she is about 9 inches long and weighs about 1 pounds. You will likely feel the baby move between 18 and 20 weeks of pregnancy. Follow these instructions at home: Medicines  Take over-the-counter and prescription medicines only as told by your doctor. Some medicines are safe and some medicines are not safe during pregnancy.  Take a prenatal vitamin that contains at  least 600 micrograms (mcg) of folic acid.  If you have trouble pooping (constipation), take medicine that will make your stool soft (stool softener) if your doctor approves. Eating and drinking   Eat regular, healthy meals.  Avoid raw meat and uncooked cheese.  If you get low calcium from the food you eat, talk to your doctor about taking a daily calcium supplement.  Avoid foods that are high in fat and sugars, such as fried and sweet foods.  If you feel sick to your stomach (nauseous) or throw up (vomit): ? Eat 4 or 5 small meals a day instead of 3 large meals. ? Try eating a few soda crackers. ? Drink liquids between meals instead of during meals.  To prevent constipation: ? Eat foods that are high in fiber, like fresh fruits and vegetables, whole grains, and beans. ? Drink enough fluids to keep your pee (urine) clear or pale yellow. Activity  Exercise only as told by your doctor. Stop exercising if you start to have cramps.  Do not exercise if it is too hot, too humid, or if you are in a place of great height (high altitude).  Avoid heavy lifting.  Wear low-heeled shoes. Sit and stand up straight.  You can continue to have sex unless your doctor tells you not to. Relieving pain and discomfort  Wear a good support bra if your breasts are tender.  Take warm water baths (sitz baths) to soothe pain or discomfort caused by hemorrhoids. Use hemorrhoid cream if your doctor approves.  Rest with your legs raised if you have leg cramps or low back pain.  If you develop puffy, bulging veins (varicose veins) in your legs: ? Wear support hose or compression stockings as told by your doctor. ? Raise (elevate) your feet for 15 minutes, 3-4 times a day. ? Limit salt in your food. Prenatal care  Write down your questions. Take them to your prenatal visits.  Keep all your prenatal visits as told by your doctor. This is important. Safety  Wear your seat belt when driving.  Make  a list of emergency phone numbers, including numbers for family, friends, the hospital, and police and fire departments. General instructions  Ask your doctor about the right foods to eat or for help finding a counselor, if you need these services.  Ask your doctor about local prenatal classes. Begin classes before month 6 of your pregnancy.  Do not use hot tubs, steam rooms, or saunas.  Do not douche or use tampons or scented sanitary pads.  Do not cross your legs for long periods of time.  Visit your dentist if you have not done so. Use a soft toothbrush to brush your teeth. Floss gently.  Avoid all smoking, herbs, and alcohol. Avoid drugs that are not approved by your doctor.  Do not use any products that contain nicotine or tobacco, such as cigarettes and e-cigarettes. If you need help quitting, ask your doctor.  Avoid cat litter boxes and soil used by cats. These carry germs that can cause birth  defects in the baby and can cause a loss of your baby (miscarriage) or stillbirth. Contact a doctor if:  You have mild cramps or pressure in your lower belly.  You have pain when you pee (urinate).  You have bad smelling fluid coming from your vagina.  You continue to feel sick to your stomach (nauseous), throw up (vomit), or have watery poop (diarrhea).  You have a nagging pain in your belly area.  You feel dizzy. Get help right away if:  You have a fever.  You are leaking fluid from your vagina.  You have spotting or bleeding from your vagina.  You have severe belly cramping or pain.  You lose or gain weight rapidly.  You have trouble catching your breath and have chest pain.  You notice sudden or extreme puffiness (swelling) of your face, hands, ankles, feet, or legs.  You have not felt the baby move in over an hour.  You have severe headaches that do not go away when you take medicine.  You have trouble seeing. Summary  The second trimester is from week 14  through week 27 (months 4 through 6). This is often the time in pregnancy that you feel your best.  To take care of yourself and your unborn baby, you will need to eat healthy meals, take medicines only if your doctor tells you to do so, and do activities that are safe for you and your baby.  Call your doctor if you get sick or if you notice anything unusual about your pregnancy. Also, call your doctor if you need help with the right food to eat, or if you want to know what activities are safe for you. This information is not intended to replace advice given to you by your health care provider. Make sure you discuss any questions you have with your health care provider. Document Revised: 02/19/2019 Document Reviewed: 12/03/2016 Elsevier Patient Education  2020 [employer].

## 2020-10-26 LAB — CERVICOVAGINAL ANCILLARY ONLY
Bacterial Vaginitis (gardnerella): POSITIVE — AB
Candida Glabrata: NEGATIVE
Candida Vaginitis: NEGATIVE
Chlamydia: NEGATIVE
Comment: NEGATIVE
Comment: NEGATIVE
Comment: NEGATIVE
Comment: NEGATIVE
Comment: NEGATIVE
Comment: NORMAL
Neisseria Gonorrhea: NEGATIVE
Trichomonas: NEGATIVE

## 2020-10-27 LAB — CBC/D/PLT+RPR+RH+ABO+RUB AB...
Antibody Screen: NEGATIVE
Basophils Absolute: 0 10*3/uL (ref 0.0–0.2)
Basos: 0 %
EOS (ABSOLUTE): 0 10*3/uL (ref 0.0–0.4)
Eos: 0 %
HCV Ab: <0.1 s/co ratio (ref 0.0–0.9)
HIV Screen 4th Generation wRfx: NONREACTIVE
Hematocrit: 38.7 % (ref 34.0–46.6)
Hemoglobin: 12.9 g/dL (ref 11.1–15.9)
Hepatitis B Surface Ag: NEGATIVE
Immature Grans (Abs): 0.1 10*3/uL (ref 0.0–0.1)
Immature Granulocytes: 1 %
Lymphocytes Absolute: 1.3 10*3/uL (ref 0.7–3.1)
Lymphs: 14 %
MCH: 29.3 pg (ref 26.6–33.0)
MCHC: 33.3 g/dL (ref 31.5–35.7)
MCV: 88 fL (ref 79–97)
Monocytes Absolute: 0.5 10*3/uL (ref 0.1–0.9)
Monocytes: 5 %
Neutrophils Absolute: 7.4 10*3/uL — ABNORMAL HIGH (ref 1.4–7.0)
Neutrophils: 80 %
Platelets: 183 10*3/uL (ref 150–450)
RBC: 4.4 x10E6/uL (ref 3.77–5.28)
RDW: 13.3 % (ref 11.7–15.4)
RPR Ser Ql: NONREACTIVE
Rh Factor: POSITIVE
Rubella Antibodies, IGG: 1.6 index (ref >0.99)
WBC: 9.3 10*3/uL (ref 3.4–10.8)

## 2020-10-27 LAB — AFP, SERUM, OPEN SPINA BIFIDA
AFP MoM: 1.18
AFP Value: 50.7 ng/mL
Gest. Age on Collection Date: 16 weeks
Maternal Age At EDD: 25 yr
OSBR Risk 1 IN: 6916
Test Results:: NEGATIVE
Weight: 105 [lb_av]

## 2020-10-27 LAB — CYTOLOGY - PAP
Comment: NEGATIVE
Diagnosis: NEGATIVE
High risk HPV: NEGATIVE

## 2020-10-27 LAB — HCV INTERPRETATION

## 2020-10-27 LAB — URINE CULTURE, OB REFLEX

## 2020-10-27 LAB — CULTURE, OB URINE

## 2020-10-31 ENCOUNTER — Encounter: Payer: Self-pay | Admitting: Obstetrics & Gynecology

## 2020-11-06 ENCOUNTER — Encounter: Payer: Self-pay | Admitting: Obstetrics & Gynecology

## 2020-11-09 ENCOUNTER — Encounter: Payer: Self-pay | Admitting: Obstetrics & Gynecology

## 2020-11-09 ENCOUNTER — Ambulatory Visit (INDEPENDENT_AMBULATORY_CARE_PROVIDER_SITE_OTHER): Payer: Medicaid Other | Admitting: Obstetrics & Gynecology

## 2020-11-09 ENCOUNTER — Other Ambulatory Visit: Payer: Self-pay

## 2020-11-09 DIAGNOSIS — O09899 Supervision of other high risk pregnancies, unspecified trimester: Secondary | ICD-10-CM | POA: Insufficient documentation

## 2020-11-09 DIAGNOSIS — Z8751 Personal history of pre-term labor: Secondary | ICD-10-CM | POA: Insufficient documentation

## 2020-11-09 DIAGNOSIS — Z3482 Encounter for supervision of other normal pregnancy, second trimester: Secondary | ICD-10-CM

## 2020-11-09 DIAGNOSIS — O09892 Supervision of other high risk pregnancies, second trimester: Secondary | ICD-10-CM

## 2020-11-09 DIAGNOSIS — Z3A18 18 weeks gestation of pregnancy: Secondary | ICD-10-CM | POA: Diagnosis not present

## 2020-11-09 DIAGNOSIS — Z3480 Encounter for supervision of other normal pregnancy, unspecified trimester: Secondary | ICD-10-CM

## 2020-11-09 HISTORY — DX: Personal history of pre-term labor: Z87.51

## 2020-11-09 MED ORDER — HYDROXYPROGESTERONE CAPROATE 275 MG/1.1ML ~~LOC~~ SOAJ
275.0000 mg | Freq: Once | SUBCUTANEOUS | Status: AC
  Administered 2020-11-09: 275 mg via SUBCUTANEOUS

## 2020-11-09 NOTE — Progress Notes (Signed)
Pt presents for ROB c/o low abd pain.  17p started today given LD without difficulty AFP negative

## 2020-11-09 NOTE — Progress Notes (Signed)
   PRENATAL VISIT NOTE  Subjective:  Karina [name] is a 24 y.o. G2P0101 at [redacted]w[redacted]d being seen today for ongoing prenatal care.  She is currently monitored for the following issues for this high-risk pregnancy and has Supervision of other normal pregnancy, antepartum on their problem list.  Patient reports no complaints.  Contractions: Not present. Vag. Bleeding: None.  Movement: Absent. Denies leaking of fluid.   The following portions of the patient's history were reviewed and updated as appropriate: allergies, current medications, past family history, past medical history, past social history, past surgical history and problem list.   Objective:   Vitals:   11/09/20 1616  BP: 107/72  Pulse: (!) 121  Weight: 90 lb (40.8 kg)    Fetal Status: Fetal Heart Rate (bpm): 140   Movement: Absent     General:  Alert, oriented and cooperative. Patient is in no acute distress.  Skin: Skin is warm and dry. No rash noted.   Cardiovascular: Normal heart rate noted  Respiratory: Normal respiratory effort, no problems with respiration noted  Abdomen: Soft, gravid, appropriate for gestational age.  Pain/Pressure: Absent     Pelvic: Cervical exam deferred        Extremities: Normal range of motion.  Edema: None  Mental Status: Normal mood and affect. Normal behavior. Normal judgment and thought content.   Assessment and Plan:  Pregnancy: G2P0101 at [redacted]w[redacted]d 1. Supervision of other normal pregnancy, antepartum 17 P initiated for h/o PTB  Preterm labor symptoms and general obstetric precautions including but not limited to vaginal bleeding, contractions, leaking of fluid and fetal movement were reviewed in detail with the patient. Please refer to After Visit Summary for other counseling recommendations.   Return in about 4 weeks (around 12/07/2020).  Future Appointments  Date Time Provider Department Center  11/22/2020  1:45 PM [facility]  12/07/2020  8:55 AM Rasch, Harolyn Rutherford, NP  [facility] None    Scheryl Darter, MD

## 2020-11-09 NOTE — Patient Instructions (Signed)

## 2020-11-11 NOTE — L&D Delivery Note (Signed)
Delivery Note Karina [name] is a 25 y.o. G2P0101 at [redacted]w[redacted]d admitted for IOL for cholestasis.   GBS Status: negative Negative/-- (05/06 1142)  Labor course: Initial SVE: 3/70/-1. Augmentation with: AROM and Pitocin. She then progressed to complete.  ROM: 2h 79m with clear fluid  Birth: At 1838 a viable female was delivered via spontaneous vaginal delivery (Presentation: vertex; ROA). Nuchal/body cord present x1. Shoulders and body delivered in usual fashion. Infant placed directly on mom's abdomen for bonding/skin-to-skin, baby dried and stimulated. Cord clamped x 2 after 3 minutes and cut by FOB. Cord blood collected. The placenta separated spontaneously and delivered via gentle cord traction.  Pitocin infused rapidly IV per protocol. Fundus firm with massage.  Placenta inspected and appears to be intact with a 3 VC.  Placenta/Cord with the following complications: none.  Cord pH: n/a Sponge and instrument count were correct x2.  Intrapartum complications:  None Anesthesia:  epidural Episiotomy: none Lacerations:  1st degree Suture Repair: 3.0 vicryl EBL (mL):   Infant: APGAR (1 MIN): 9   APGAR (5 MINS): 9   Infant weight: pending  Mom to postpartum.  Baby to Couplet care / Skin to Skin. Placenta to L&D   Plans to Breast and bottlefeed Contraception: Depo-Provera injections Circumcision: N/A  Note sent to [facility]: [facility] for pp visit.  Brand Males CNM 03/21/2021 7:09 PM

## 2020-11-13 ENCOUNTER — Other Ambulatory Visit: Payer: Self-pay | Admitting: *Deleted

## 2020-11-13 DIAGNOSIS — Z3480 Encounter for supervision of other normal pregnancy, unspecified trimester: Secondary | ICD-10-CM

## 2020-11-13 MED ORDER — BLOOD PRESSURE KIT DEVI: 1.0000 | 0 refills | Status: DC

## 2020-11-13 NOTE — Progress Notes (Signed)
Blood pressure cuff originally sent to regular pharmacy.  Rx was resent today to [facility].

## 2020-11-16 ENCOUNTER — Ambulatory Visit (INDEPENDENT_AMBULATORY_CARE_PROVIDER_SITE_OTHER): Payer: Medicaid Other

## 2020-11-16 ENCOUNTER — Other Ambulatory Visit: Payer: Self-pay

## 2020-11-16 DIAGNOSIS — Z8751 Personal history of pre-term labor: Secondary | ICD-10-CM

## 2020-11-16 DIAGNOSIS — O09899 Supervision of other high risk pregnancies, unspecified trimester: Secondary | ICD-10-CM

## 2020-11-16 MED ORDER — HYDROXYPROGESTERONE CAPROATE 275 MG/1.1ML ~~LOC~~ SOAJ
275.0000 mg | Freq: Once | SUBCUTANEOUS | Status: AC
  Administered 2020-11-16: 275 mg via SUBCUTANEOUS

## 2020-11-16 NOTE — Progress Notes (Signed)
Patient presents for 17p injection. Injection given in left arm. Patient tolerated well. 

## 2020-11-16 NOTE — Progress Notes (Signed)
Patient was assessed and managed by nursing staff during this encounter. I have reviewed the chart and agree with the documentation and plan. I have also made any necessary editorial changes.  Catalina Antigua, MD 11/16/2020 9:46 AM

## 2020-11-22 ENCOUNTER — Other Ambulatory Visit: Payer: Self-pay

## 2020-11-22 ENCOUNTER — Ambulatory Visit: Payer: Medicaid Other | Attending: Obstetrics & Gynecology

## 2020-11-22 DIAGNOSIS — Z3480 Encounter for supervision of other normal pregnancy, unspecified trimester: Secondary | ICD-10-CM | POA: Diagnosis present

## 2020-11-23 ENCOUNTER — Other Ambulatory Visit: Payer: Self-pay | Admitting: *Deleted

## 2020-11-23 ENCOUNTER — Other Ambulatory Visit: Payer: Self-pay

## 2020-11-23 ENCOUNTER — Ambulatory Visit (INDEPENDENT_AMBULATORY_CARE_PROVIDER_SITE_OTHER): Payer: Medicaid Other

## 2020-11-23 VITALS — BP 106/68 | HR 90

## 2020-11-23 DIAGNOSIS — Z362 Encounter for other antenatal screening follow-up: Secondary | ICD-10-CM

## 2020-11-23 DIAGNOSIS — Z8751 Personal history of pre-term labor: Secondary | ICD-10-CM | POA: Diagnosis not present

## 2020-11-23 DIAGNOSIS — O09899 Supervision of other high risk pregnancies, unspecified trimester: Secondary | ICD-10-CM

## 2020-11-23 MED ORDER — HYDROXYPROGESTERONE CAPROATE 275 MG/1.1ML ~~LOC~~ SOAJ
275.0000 mg | SUBCUTANEOUS | Status: DC
  Administered 2020-11-23 – 2021-02-22 (×12): 275 mg via SUBCUTANEOUS

## 2020-11-23 NOTE — Progress Notes (Signed)
Pt is in the office for 17-p injection, administered in R arm and pt tolerated well .Marland Kitchen Administrations This Visit    HYDROXYprogesterone caproate (Makena) autoinjector 275 mg    Admin Date 11/23/2020 Action Given Dose 275 mg Route Subcutaneous Administered By Katrina Stack, RN

## 2020-11-23 NOTE — Progress Notes (Signed)
Patient was assessed and managed by nursing staff during this encounter. I have reviewed the chart and agree with the documentation and plan. I have also made any necessary editorial changes.  Catalina Antigua, MD 11/23/2020 8:52 AM

## 2020-11-30 ENCOUNTER — Ambulatory Visit (INDEPENDENT_AMBULATORY_CARE_PROVIDER_SITE_OTHER): Payer: Medicaid Other

## 2020-11-30 ENCOUNTER — Other Ambulatory Visit: Payer: Self-pay

## 2020-11-30 DIAGNOSIS — O09899 Supervision of other high risk pregnancies, unspecified trimester: Secondary | ICD-10-CM

## 2020-11-30 DIAGNOSIS — Z8751 Personal history of pre-term labor: Secondary | ICD-10-CM

## 2020-11-30 NOTE — Progress Notes (Signed)
Patient was assessed and managed by nursing staff during this encounter. I have reviewed the chart and agree with the documentation and plan. I have also made any necessary editorial changes.  Catalina Antigua, MD 11/30/2020 9:16 AM

## 2020-11-30 NOTE — Progress Notes (Signed)
Pt is in the office for 17-p injection, administered in L arm and pt tolerated well. .. Administrations This Visit    HYDROXYprogesterone caproate (Makena) autoinjector 275 mg    Admin Date 11/30/2020 Action Given Dose 275 mg Route Subcutaneous Administered By Katrina Stack, RN

## 2020-12-07 ENCOUNTER — Encounter: Payer: Medicaid Other | Admitting: Obstetrics and Gynecology

## 2020-12-08 ENCOUNTER — Encounter: Payer: Self-pay | Admitting: Obstetrics and Gynecology

## 2020-12-08 ENCOUNTER — Other Ambulatory Visit: Payer: Self-pay

## 2020-12-08 ENCOUNTER — Ambulatory Visit (INDEPENDENT_AMBULATORY_CARE_PROVIDER_SITE_OTHER): Payer: Medicaid Other | Admitting: Obstetrics and Gynecology

## 2020-12-08 VITALS — BP 97/63 | HR 89 | Wt 94.4 lb

## 2020-12-08 DIAGNOSIS — Z8751 Personal history of pre-term labor: Secondary | ICD-10-CM

## 2020-12-08 DIAGNOSIS — O09899 Supervision of other high risk pregnancies, unspecified trimester: Secondary | ICD-10-CM

## 2020-12-08 DIAGNOSIS — Z3480 Encounter for supervision of other normal pregnancy, unspecified trimester: Secondary | ICD-10-CM

## 2020-12-08 MED ORDER — HYDROXYPROGESTERONE CAPROATE 275 MG/1.1ML ~~LOC~~ SOAJ
275.0000 mg | Freq: Once | SUBCUTANEOUS | Status: AC
  Administered 2020-12-08: 275 mg via SUBCUTANEOUS

## 2020-12-08 NOTE — Progress Notes (Signed)
   PRENATAL VISIT NOTE  Subjective:  Karina [name] is a 25 y.o. G2P0101 at [redacted]w[redacted]d being seen today for ongoing prenatal care.  She is currently monitored for the following issues for this high-risk pregnancy and has Supervision of other normal pregnancy, antepartum and History of preterm delivery, currently pregnant on their problem list.  Patient reports pruritis of upper extremities and upper abdomen.  Contractions: Not present. Vag. Bleeding: None.  Movement: Present. Denies leaking of fluid.   The following portions of the patient's history were reviewed and updated as appropriate: allergies, current medications, past family history, past medical history, past social history, past surgical history and problem list.   Objective:   Vitals:   12/08/20 0902  BP: 97/63  Pulse: 89  Weight: 94 lb 6.4 oz (42.8 kg)    Fetal Status: Fetal Heart Rate (bpm): 144 (Simultaneous filing. User may not have seen previous data.) Fundal Height: 22 cm Movement: Present     General:  Alert, oriented and cooperative. Patient is in no acute distress.  Skin: Skin is warm and dry. No rash noted.   Cardiovascular: Normal heart rate noted  Respiratory: Normal respiratory effort, no problems with respiration noted  Abdomen: Soft, gravid, appropriate for gestational age.  Pain/Pressure: Absent     Pelvic: Cervical exam deferred        Extremities: Normal range of motion.  Edema: None  Mental Status: Normal mood and affect. Normal behavior. Normal judgment and thought content.   Assessment and Plan:  Pregnancy: G2P0101 at [redacted]w[redacted]d 1. History of preterm delivery, currently pregnant Continue weekly 17-P - HYDROXYprogesterone caproate (Makena) autoinjector 275 mg  2. Supervision of other normal pregnancy, antepartum Patient is doing well Third trimester labs with glucola next visit Follow up growth ultrasound scheduled Bile acids ordered to rule out cholestasis of pregnancy - Bile acids,  total  Preterm labor symptoms and general obstetric precautions including but not limited to vaginal bleeding, contractions, leaking of fluid and fetal movement were reviewed in detail with the patient. Please refer to After Visit Summary for other counseling recommendations.   Return in about 4 weeks (around 01/05/2021) for in person, ROB, Low risk, 2 hr glucola next visit.  Future Appointments  Date Time Provider Department Center  12/22/2020 10:45 AM [facility] US[facility]    Catalina Antigua, MD

## 2020-12-08 NOTE — Progress Notes (Signed)
ROB 22w  17 P   CC: pt notes getting itchy on arms and shoulders not sure what is causing skin irritation happens throughout the day.

## 2020-12-10 LAB — BILE ACIDS, TOTAL: Bile Acids Total: 4.3 umol/L (ref 0.0–10.0)

## 2020-12-15 ENCOUNTER — Ambulatory Visit (INDEPENDENT_AMBULATORY_CARE_PROVIDER_SITE_OTHER): Payer: Medicaid Other

## 2020-12-15 ENCOUNTER — Other Ambulatory Visit: Payer: Self-pay

## 2020-12-15 DIAGNOSIS — Z8751 Personal history of pre-term labor: Secondary | ICD-10-CM | POA: Diagnosis not present

## 2020-12-15 DIAGNOSIS — O09899 Supervision of other high risk pregnancies, unspecified trimester: Secondary | ICD-10-CM

## 2020-12-15 NOTE — Progress Notes (Signed)
Pt is in the office for 17-p injection, administered in R arm and pt tolerated well .Marland Kitchen Administrations This Visit    HYDROXYprogesterone caproate (Makena) autoinjector 275 mg    Admin Date 12/15/2020 Action Given Dose 275 mg Route Subcutaneous Administered By Katrina Stack, RN

## 2020-12-15 NOTE — Progress Notes (Signed)
Chart reviewed for nurse visit. Agree with plan of care.   Venora Maples, MD 12/15/20 8:43 AM

## 2020-12-22 ENCOUNTER — Ambulatory Visit: Payer: Medicaid Other | Attending: Obstetrics and Gynecology

## 2020-12-22 ENCOUNTER — Other Ambulatory Visit: Payer: Self-pay

## 2020-12-22 ENCOUNTER — Ambulatory Visit (INDEPENDENT_AMBULATORY_CARE_PROVIDER_SITE_OTHER): Payer: Medicaid Other | Admitting: *Deleted

## 2020-12-22 VITALS — BP 92/61 | HR 95

## 2020-12-22 DIAGNOSIS — O09292 Supervision of pregnancy with other poor reproductive or obstetric history, second trimester: Secondary | ICD-10-CM

## 2020-12-22 DIAGNOSIS — Z8751 Personal history of pre-term labor: Secondary | ICD-10-CM

## 2020-12-22 DIAGNOSIS — Z362 Encounter for other antenatal screening follow-up: Secondary | ICD-10-CM | POA: Diagnosis not present

## 2020-12-22 DIAGNOSIS — Z3A24 24 weeks gestation of pregnancy: Secondary | ICD-10-CM | POA: Diagnosis not present

## 2020-12-22 DIAGNOSIS — O09899 Supervision of other high risk pregnancies, unspecified trimester: Secondary | ICD-10-CM

## 2020-12-22 DIAGNOSIS — Z3480 Encounter for supervision of other normal pregnancy, unspecified trimester: Secondary | ICD-10-CM

## 2020-12-22 NOTE — Progress Notes (Signed)
Patient was assessed and managed by nursing staff during this encounter. I have reviewed the chart and agree with the documentation and plan. I have also made any necessary editorial changes.  Scheryl Darter, MD 12/22/2020 10:41 AM  Patient ID: Karina [name], female   DOB: 1996-03-30, 25 y.o.   MRN: [id number]

## 2020-12-22 NOTE — Progress Notes (Signed)
Pt is in office for 17p injection.  Injection given in left arm, pt tolerated well.   Pt has no other concerns today.   Administrations This Visit    HYDROXYprogesterone caproate (Makena) autoinjector 275 mg    Admin Date 12/22/2020 Action Given Dose 275 mg Route Subcutaneous Administered By Lanney Gins, CMA

## 2020-12-29 ENCOUNTER — Other Ambulatory Visit: Payer: Self-pay

## 2020-12-29 ENCOUNTER — Ambulatory Visit (INDEPENDENT_AMBULATORY_CARE_PROVIDER_SITE_OTHER): Payer: Medicaid Other

## 2020-12-29 DIAGNOSIS — Z8751 Personal history of pre-term labor: Secondary | ICD-10-CM

## 2020-12-29 DIAGNOSIS — O09899 Supervision of other high risk pregnancies, unspecified trimester: Secondary | ICD-10-CM

## 2020-12-29 NOTE — Progress Notes (Signed)
Makena 1.25mL given Right arm. Pt tolerated well, no adverse side effects noted. Pt had no additional complaints today. Will return in one week for repeat injection.

## 2021-01-05 ENCOUNTER — Ambulatory Visit (INDEPENDENT_AMBULATORY_CARE_PROVIDER_SITE_OTHER): Payer: Medicaid Other | Admitting: Obstetrics and Gynecology

## 2021-01-05 ENCOUNTER — Other Ambulatory Visit: Payer: Self-pay

## 2021-01-05 ENCOUNTER — Other Ambulatory Visit: Payer: Medicaid Other

## 2021-01-05 VITALS — BP 104/68 | HR 90 | Wt 99.0 lb

## 2021-01-05 DIAGNOSIS — Z8719 Personal history of other diseases of the digestive system: Secondary | ICD-10-CM

## 2021-01-05 DIAGNOSIS — Z8759 Personal history of other complications of pregnancy, childbirth and the puerperium: Secondary | ICD-10-CM

## 2021-01-05 DIAGNOSIS — O09899 Supervision of other high risk pregnancies, unspecified trimester: Secondary | ICD-10-CM

## 2021-01-05 DIAGNOSIS — Z23 Encounter for immunization: Secondary | ICD-10-CM | POA: Diagnosis not present

## 2021-01-05 DIAGNOSIS — Z3A26 26 weeks gestation of pregnancy: Secondary | ICD-10-CM | POA: Diagnosis not present

## 2021-01-05 DIAGNOSIS — O09892 Supervision of other high risk pregnancies, second trimester: Secondary | ICD-10-CM

## 2021-01-05 DIAGNOSIS — Z3480 Encounter for supervision of other normal pregnancy, unspecified trimester: Secondary | ICD-10-CM

## 2021-01-05 MED ORDER — HYDROXYPROGESTERONE CAPROATE 275 MG/1.1ML ~~LOC~~ SOAJ
275.0000 mg | Freq: Once | SUBCUTANEOUS | Status: AC
Start: 2021-01-05 — End: 2021-01-05
  Administered 2021-01-05: 275 mg via SUBCUTANEOUS

## 2021-01-05 NOTE — Progress Notes (Signed)
Patient presents for ROB and GTT. Patient given 17-p injection. She has no concerns today. TDAP vaccine given

## 2021-01-05 NOTE — Progress Notes (Signed)
   PRENATAL VISIT NOTE  Subjective:  Karina [name] is a 25 y.o. G2P0101 at [redacted]w[redacted]d being seen today for ongoing prenatal care.  She is currently monitored for the following issues for this high-risk pregnancy and has Supervision of other normal pregnancy, antepartum and History of preterm delivery, currently pregnant on their problem list.  Patient reports no complaints.  Contractions: Not present. Vag. Bleeding: None.  Movement: Present. Denies leaking of fluid.  Doing well overall.   The following portions of the patient's history were reviewed and updated as appropriate: allergies, current medications, past family history, past medical history, past social history, past surgical history and problem list.   Objective:   Vitals:   01/05/21 0828  BP: 104/68  Pulse: 90  Weight: 99 lb (44.9 kg)    Fetal Status:     Movement: Present     General:  Alert, oriented and cooperative. Patient is in no acute distress.  Skin: Skin is warm and dry. No rash noted.   Cardiovascular: Normal heart rate noted  Respiratory: Normal respiratory effort, no problems with respiration noted  Abdomen: Soft, gravid, appropriate for gestational age.  Pain/Pressure: Absent     Pelvic: Cervical exam deferred        Extremities: Normal range of motion.  Edema: None  Mental Status: Normal mood and affect. Normal behavior. Normal judgment and thought content.   Assessment and Plan:  Pregnancy: G2P0101 at [redacted]w[redacted]d 1. Supervision of other normal pregnancy, antepartum - Glucose Tolerance, 2 Hours w/1 Hour - CBC - HIV Antibody (routine testing w rflx) - RPR - Tdap vaccine greater than or equal to 7yo IM  2. [redacted] weeks gestation of pregnancy - Glucose Tolerance, 2 Hours w/1 Hour - CBC - HIV Antibody (routine testing w rflx) - RPR - Tdap vaccine greater than or equal to 7yo IM  3. History of preterm delivery, currently pregnant - HYDROXYprogesterone caproate (Makena) autoinjector 275 mg - Prior delivery at  35 weeks.   4. History of cholestasis during pregnancy -no symptoms with this pregnancy. Did have some itching at site of makena injection that resolved. Bile acids checked once during this pregnancy were normal.   Preterm labor symptoms and general obstetric precautions including but not limited to vaginal bleeding, contractions, leaking of fluid and fetal movement were reviewed in detail with the patient. Please refer to After Visit Summary for other counseling recommendations.   Return in about 4 weeks (around 02/02/2021) for any provider, OB.  No future appointments.  Gita Kudo, MD

## 2021-01-06 LAB — CBC
Hematocrit: 31.7 % — ABNORMAL LOW (ref 34.0–46.6)
Hemoglobin: 10.7 g/dL — ABNORMAL LOW (ref 11.1–15.9)
MCH: 28.8 pg (ref 26.6–33.0)
MCHC: 33.8 g/dL (ref 31.5–35.7)
MCV: 85 fL (ref 79–97)
Platelets: 185 10*3/uL (ref 150–450)
RBC: 3.71 x10E6/uL — ABNORMAL LOW (ref 3.77–5.28)
RDW: 11.6 % — ABNORMAL LOW (ref 11.7–15.4)
WBC: 7.6 10*3/uL (ref 3.4–10.8)

## 2021-01-06 LAB — RPR: RPR Ser Ql: NONREACTIVE

## 2021-01-06 LAB — GLUCOSE TOLERANCE, 2 HOURS W/ 1HR
Glucose, 1 hour: 91 mg/dL (ref 65–179)
Glucose, 2 hour: 65 mg/dL (ref 65–152)
Glucose, Fasting: 69 mg/dL (ref 65–91)

## 2021-01-06 LAB — HIV ANTIBODY (ROUTINE TESTING W REFLEX): HIV Screen 4th Generation wRfx: NONREACTIVE

## 2021-01-12 ENCOUNTER — Ambulatory Visit (INDEPENDENT_AMBULATORY_CARE_PROVIDER_SITE_OTHER): Payer: Medicaid Other | Admitting: *Deleted

## 2021-01-12 ENCOUNTER — Other Ambulatory Visit: Payer: Self-pay

## 2021-01-12 DIAGNOSIS — Z3480 Encounter for supervision of other normal pregnancy, unspecified trimester: Secondary | ICD-10-CM | POA: Diagnosis not present

## 2021-01-12 NOTE — Progress Notes (Signed)
Pt is in office for 17p injection. Injection given, pt tolerated well.  Pt has no concerns today.  Administrations This Visit    HYDROXYprogesterone caproate (Makena) autoinjector 275 mg    Admin Date 01/12/2021 Action Given Dose 275 mg Route Subcutaneous Administered By Lanney Gins, CMA

## 2021-01-12 NOTE — Progress Notes (Signed)
I have reviewed this chart and agree with the RN/CMA assessment and management.    K. Meryl Franchot Pollitt, MD, FACOG Attending [facility]Faculty [facility])  

## 2021-01-19 ENCOUNTER — Ambulatory Visit (INDEPENDENT_AMBULATORY_CARE_PROVIDER_SITE_OTHER): Payer: Medicaid Other

## 2021-01-19 ENCOUNTER — Other Ambulatory Visit: Payer: Self-pay

## 2021-01-19 DIAGNOSIS — O09899 Supervision of other high risk pregnancies, unspecified trimester: Secondary | ICD-10-CM

## 2021-01-19 DIAGNOSIS — Z8751 Personal history of pre-term labor: Secondary | ICD-10-CM

## 2021-01-19 NOTE — Progress Notes (Signed)
OB presents for 17P Injection, given in LA, tolerated well.  Next injection in 1 week.  Administrations This Visit    HYDROXYprogesterone caproate (Makena) autoinjector 275 mg    Admin Date 01/19/2021 Action Given Dose 275 mg Route Subcutaneous Administered By Maretta Bees, RMA

## 2021-01-26 ENCOUNTER — Ambulatory Visit (INDEPENDENT_AMBULATORY_CARE_PROVIDER_SITE_OTHER): Payer: Medicaid Other

## 2021-01-26 ENCOUNTER — Other Ambulatory Visit: Payer: Self-pay

## 2021-01-26 DIAGNOSIS — O09899 Supervision of other high risk pregnancies, unspecified trimester: Secondary | ICD-10-CM

## 2021-01-26 DIAGNOSIS — Z8751 Personal history of pre-term labor: Secondary | ICD-10-CM

## 2021-01-26 NOTE — Progress Notes (Signed)
Pt is in the office for 17-p injection, administered in R arm and pt tolerated well. .. Administrations This Visit    HYDROXYprogesterone caproate (Makena) autoinjector 275 mg    Admin Date 01/26/2021 Action Given Dose 275 mg Route Subcutaneous Administered By Katrina Stack, RN

## 2021-02-01 ENCOUNTER — Other Ambulatory Visit: Payer: Self-pay

## 2021-02-01 ENCOUNTER — Ambulatory Visit (INDEPENDENT_AMBULATORY_CARE_PROVIDER_SITE_OTHER): Payer: Medicaid Other | Admitting: Obstetrics and Gynecology

## 2021-02-01 VITALS — BP 104/66 | HR 102 | Wt 103.3 lb

## 2021-02-01 DIAGNOSIS — Z3480 Encounter for supervision of other normal pregnancy, unspecified trimester: Secondary | ICD-10-CM

## 2021-02-01 DIAGNOSIS — Z3A30 30 weeks gestation of pregnancy: Secondary | ICD-10-CM | POA: Diagnosis not present

## 2021-02-01 DIAGNOSIS — O09899 Supervision of other high risk pregnancies, unspecified trimester: Secondary | ICD-10-CM

## 2021-02-01 NOTE — Progress Notes (Addendum)
Pt reports fetal movement with occasional uterine irritability. Administered 17-p injection in L arm and pt tolerated well .Marland Kitchen Administrations This Visit    HYDROXYprogesterone caproate (Makena) autoinjector 275 mg    Admin Date 02/01/2021 Action Given Dose 275 mg Route Subcutaneous Administered By Katrina Stack, RN

## 2021-02-01 NOTE — Patient Instructions (Signed)
Preventing Preterm Birth Preterm birth is when a baby is delivered between 20 weeks and 37 weeks of pregnancy. A full-term pregnancy lasts for at least 37 weeks. Preterm birth can be dangerous for your baby because the last few weeks of pregnancy are an important time for your baby to grow and reach a normal birth weight. How can preterm birth affect my baby? Complications of preterm birth may include:  Breathing problems.  Brain damage that affects movement and coordination (cerebral palsy).  Trouble with feeding.  Problems with vision or hearing.  Infections or inflammation of the digestive tract (colitis).  Developmental delays and learning disabilities.  Low birth weight or very low birth weight.  Higher risk for diabetes, heart disease, and high blood pressure later in life. What can increase my risk of having a preterm birth? The exact cause of preterm birth is unknown. The following factors make you more likely to have a preterm birth:  Being diagnosed with placenta previa. This is a condition in which the placenta covers the lowest part of your uterus (cervix), which opens into the vagina.  Certain conditions of your current and past pregnancies, such as: ? Having had a preterm birth before. ? Being pregnant with multiples. ? Waiting less than 6 months between giving birth and becoming pregnant again. ? Certain abnormalities in your unborn baby. ? Vaginal bleeding during pregnancy. ? Becoming pregnant through in vitro fertilization (IVF).  Being overweight or underweight.  Medical history of: ? STIs (sexually transmitted infections) or other infections of the urinary tract and the vagina. ? Long-term (chronic) illnesses, such as blood clotting problems, diabetes, or high blood pressure. ? Short cervix.  Lifestyle and environmental factors, such as: ? Using tobacco products or drugs. ? Drinking alcohol. ? Having stress and no social support. ? Violence in the home  (domestic violence). ? Being exposed to certain chemicals or pollutants in the environment. What actions can I take to prevent preterm birth? Medical care The most important thing you can do to lower your risk for preterm birth is to get routine medical care during pregnancy (prenatal care). Keep all follow-up visits as told by your health care provider. This is important.  If you have a high risk of preterm birth:  You may be referred to a health care provider who specializes in managing high-risk pregnancies (perinatologist).  You may be given medicine to help prevent preterm birth. Lifestyle Certain lifestyle changes can also lower your risk of preterm birth:  Wait at least 6 months after a pregnancy to become pregnant again.  Get to a healthy weight before getting pregnant. If you are overweight, work with your health care provider to safely lose weight.  Do not use any products that contain nicotine or tobacco, such as cigarettes, e-cigarettes, and chewing tobacco. If you need help quitting, ask your health care provider.  Do not drink alcohol.  Do not use drugs.  Eat a healthy diet.  Manage other medical problems, such as diabetes or high blood pressure.   Where to find support For more support, consider:  Talking with your health care provider.  Talking with a therapist or substance abuse counselor, if you need help quitting.  Working with a dietitian or a personal trainer to maintain a healthy weight.  Joining a support group. Where to find more information Learn more about preventing preterm birth from:  Centers for Disease Control and Prevention: cdc.gov  March of Dimes: marchofdimes.org  American Pregnancy Association: americanpregnancy.org Contact a   health care provider if: You have any of the following signs or symptoms of preterm labor before 37 weeks:  A change or increase in vaginal discharge.  Fluid leaking from your vagina.  Pressure or cramps in  your lower abdomen.  A backache that does not go away or gets worse.  Regular tightening (contractions) in your lower abdomen. Get help right away if:  You are having regular painful contractions every 5 minutes or less.  Your water breaks. Summary  Preterm birth means having your baby during weeks 20-37 of pregnancy.  Preterm birth may put your baby at risk for physical and mental problems.  The exact cause of preterm birth is unknown. However, being diagnosed with placenta previa or having vaginal bleeding or an STI (sexually transmitted infection) increases your risk for preterm birth.  Getting good prenatal care can help prevent preterm birth. Keep all follow-up visits as told by your health care provider. This is important.  Contact a health care provider if you have signs or symptoms of preterm labor. This information is not intended to replace advice given to you by your health care provider. Make sure you discuss any questions you have with your health care provider. Document Revised: 10/04/2019 Document Reviewed: 10/04/2019 Elsevier Patient Education  2021 Elsevier Inc.  

## 2021-02-01 NOTE — Progress Notes (Signed)
° °  PRENATAL VISIT NOTE  Subjective:  Karina [name] is a 25 y.o. G2P0101 at [redacted]w[redacted]d being seen today for ongoing prenatal care.  She is currently monitored for the following issues for this low-risk pregnancy and has [redacted] weeks gestation of pregnancy; Supervision of other normal pregnancy, antepartum; and History of preterm delivery, currently pregnant on their problem list.  Patient doing well with no acute concerns today. She reports occasional braxton-hicks ctx.  Contractions: Irritability. Vag. Bleeding: None.  Movement: Present. Denies leaking of fluid.   The following portions of the patient's history were reviewed and updated as appropriate: allergies, current medications, past family history, past medical history, past social history, past surgical history and problem list. Problem list updated.  Objective:   Vitals:   02/01/21 1311  BP: 104/66  Pulse: (!) 102  Weight: 103 lb 4.8 oz (46.9 kg)    Fetal Status: Fetal Heart Rate (bpm): 140 Fundal Height: 30 cm Movement: Present     General:  Alert, oriented and cooperative. Patient is in no acute distress.  Skin: Skin is warm and dry. No rash noted.   Cardiovascular: Normal heart rate noted  Respiratory: Normal respiratory effort, no problems with respiration noted  Abdomen: Soft, gravid, appropriate for gestational age.  Pain/Pressure: Present     Pelvic: Cervical exam deferred        Extremities: Normal range of motion.  Edema: None  Mental Status:  Normal mood and affect. Normal behavior. Normal judgment and thought content.   Assessment and Plan:  Pregnancy: G2P0101 at [redacted]w[redacted]d  1. [redacted] weeks gestation of pregnancy   2. History of preterm delivery, currently pregnant Pt continues weekly makena, continue until 35 weeks  3. Supervision of other normal pregnancy, antepartum   Preterm labor symptoms and general obstetric precautions including but not limited to vaginal bleeding, contractions, leaking of fluid and fetal  movement were reviewed in detail with the patient.  Please refer to After Visit Summary for other counseling recommendations.   Return in about 2 weeks (around 02/15/2021) for ROB, in person.   Mariel Aloe, MD Faculty Attending [facility]

## 2021-02-07 ENCOUNTER — Other Ambulatory Visit: Payer: Self-pay

## 2021-02-07 ENCOUNTER — Ambulatory Visit (INDEPENDENT_AMBULATORY_CARE_PROVIDER_SITE_OTHER): Payer: Medicaid Other

## 2021-02-07 DIAGNOSIS — O09899 Supervision of other high risk pregnancies, unspecified trimester: Secondary | ICD-10-CM

## 2021-02-07 DIAGNOSIS — Z8751 Personal history of pre-term labor: Secondary | ICD-10-CM | POA: Diagnosis not present

## 2021-02-07 NOTE — Progress Notes (Signed)
Pt due for 17p Makena given L arm Pt tolerated injection.

## 2021-02-16 ENCOUNTER — Ambulatory Visit (INDEPENDENT_AMBULATORY_CARE_PROVIDER_SITE_OTHER): Payer: Medicaid Other | Admitting: Obstetrics and Gynecology

## 2021-02-16 ENCOUNTER — Encounter: Payer: Self-pay | Admitting: Obstetrics and Gynecology

## 2021-02-16 ENCOUNTER — Other Ambulatory Visit: Payer: Self-pay

## 2021-02-16 VITALS — BP 105/70 | HR 90 | Wt 104.2 lb

## 2021-02-16 DIAGNOSIS — O09893 Supervision of other high risk pregnancies, third trimester: Secondary | ICD-10-CM

## 2021-02-16 DIAGNOSIS — Z3483 Encounter for supervision of other normal pregnancy, third trimester: Secondary | ICD-10-CM | POA: Diagnosis not present

## 2021-02-16 DIAGNOSIS — O09899 Supervision of other high risk pregnancies, unspecified trimester: Secondary | ICD-10-CM

## 2021-02-16 DIAGNOSIS — Z3480 Encounter for supervision of other normal pregnancy, unspecified trimester: Secondary | ICD-10-CM

## 2021-02-16 NOTE — Progress Notes (Signed)
Subjective:  Karina [name] is a 25 y.o. G2P0101 at [redacted]w[redacted]d being seen today for ongoing prenatal care.  She is currently monitored for the following issues for this high-risk pregnancy and has Supervision of other normal pregnancy, antepartum and History of preterm delivery, currently pregnant on their problem list.  Patient reports no complaints.  Contractions: Not present. Vag. Bleeding: None.  Movement: Present. Denies leaking of fluid.   The following portions of the patient's history were reviewed and updated as appropriate: allergies, current medications, past family history, past medical history, past social history, past surgical history and problem list. Problem list updated.  Objective:   Vitals:   02/16/21 0820  BP: 105/70  Pulse: 90  Weight: 104 lb 3.2 oz (47.3 kg)    Fetal Status: Fetal Heart Rate (bpm): 134   Movement: Present     General:  Alert, oriented and cooperative. Patient is in no acute distress.  Skin: Skin is warm and dry. No rash noted.   Cardiovascular: Normal heart rate noted  Respiratory: Normal respiratory effort, no problems with respiration noted  Abdomen: Soft, gravid, appropriate for gestational age. Pain/Pressure: Absent     Pelvic:  Cervical exam deferred        Extremities: Normal range of motion.     Mental Status: Normal mood and affect. Normal behavior. Normal judgment and thought content.   Urinalysis:      Assessment and Plan:  Pregnancy: G2P0101 at [redacted]w[redacted]d  1. Supervision of other normal pregnancy, antepartum Stable  2. History of preterm delivery, currently pregnant No S/Sx presently Continue with weekly 17 OHP  Preterm labor symptoms and general obstetric precautions including but not limited to vaginal bleeding, contractions, leaking of fluid and fetal movement were reviewed in detail with the patient. Please refer to After Visit Summary for other counseling recommendations.  Return in about 2 weeks (around 03/02/2021) for OB  visit, face to face, MD only.   Hermina Staggers, MD

## 2021-02-16 NOTE — Progress Notes (Signed)
Pt reports fetal movement, denies pain. Administered 17-p injection in R arm and pt tolerated well.

## 2021-02-16 NOTE — Patient Instructions (Signed)

## 2021-02-19 ENCOUNTER — Other Ambulatory Visit: Payer: Self-pay

## 2021-02-19 ENCOUNTER — Ambulatory Visit (INDEPENDENT_AMBULATORY_CARE_PROVIDER_SITE_OTHER): Payer: Medicaid Other | Admitting: Advanced Practice Midwife

## 2021-02-19 VITALS — BP 103/69 | HR 122 | Wt 103.0 lb

## 2021-02-19 DIAGNOSIS — R109 Unspecified abdominal pain: Secondary | ICD-10-CM

## 2021-02-19 DIAGNOSIS — L299 Pruritus, unspecified: Secondary | ICD-10-CM

## 2021-02-19 DIAGNOSIS — Z3483 Encounter for supervision of other normal pregnancy, third trimester: Secondary | ICD-10-CM

## 2021-02-19 DIAGNOSIS — O26893 Other specified pregnancy related conditions, third trimester: Secondary | ICD-10-CM

## 2021-02-19 DIAGNOSIS — Z3480 Encounter for supervision of other normal pregnancy, unspecified trimester: Secondary | ICD-10-CM

## 2021-02-19 MED ORDER — HYDROXYZINE PAMOATE 25 MG PO CAPS: 25.0000 mg | ORAL_CAPSULE | Freq: Three times a day (TID) | ORAL | 0 refills | Status: DC | PRN

## 2021-02-19 NOTE — Progress Notes (Signed)
ROB [redacted]w[redacted]d  CC: Itching all over body starting on legs and thighs then started itching on bottom of hands and feet itching will wake pt up at night. Per pt Hx of cholestasias.

## 2021-02-19 NOTE — Progress Notes (Signed)
   PRENATAL VISIT NOTE  Subjective:  Karina [name] is a 25 y.o. G2P0101 at [redacted]w[redacted]d being seen today for ongoing prenatal care.  She is currently monitored for the following issues for this low-risk pregnancy and has Supervision of other normal pregnancy, antepartum and History of preterm delivery, currently pregnant on their problem list.  Patient reports generalized itching.  Contractions: Irritability. Vag. Bleeding: None.  Movement: Present. Denies leaking of fluid.   The following portions of the patient's history were reviewed and updated as appropriate: allergies, current medications, past family history, past medical history, past social history, past surgical history and problem list.   Objective:   Vitals:   02/19/21 1308  BP: 103/69  Pulse: (!) 122  Weight: 103 lb (46.7 kg)    Fetal Status: Fetal Heart Rate (bpm): 163   Movement: Present     General:  Alert, oriented and cooperative. Patient is in no acute distress.  Skin: Skin is warm and dry. No rash noted.   Cardiovascular: Normal heart rate noted  Respiratory: Normal respiratory effort, no problems with respiration noted  Abdomen: Soft, gravid, appropriate for gestational age.  Pain/Pressure: Present     Pelvic: Cervical exam deferred        Extremities: Normal range of motion.  Edema: None  Mental Status: Normal mood and affect. Normal behavior. Normal judgment and thought content.   Assessment and Plan:  Pregnancy: G2P0101 at [redacted]w[redacted]d 1. Supervision of other normal pregnancy, antepartum --Anticipatory guidance about next visits/weeks of pregnancy given. --Next visit in 2 weeks in office  2. Pruritus --Pt with hx cholestasis in previous pregnancy --Itching started on legs, now on legs, abdomen, and hands and feet  - Bile acids, total - Comp Met (CMET) --Rx for Vistaril 25 mg TID PRN  3. Abdominal pain during pregnancy, third trimester --Mild irregular cramping - Culture, OB Urine  Preterm labor symptoms  and general obstetric precautions including but not limited to vaginal bleeding, contractions, leaking of fluid and fetal movement were reviewed in detail with the patient. Please refer to After Visit Summary for other counseling recommendations.   No follow-ups on file.  Future Appointments  Date Time Provider [place]  02/22/2021  1:20 PM [place] None  03/02/2021 10:45 AM Anyanwu, Sallyanne Havers, MD [place] None    Fatima Blank, CNM

## 2021-02-21 ENCOUNTER — Telehealth: Payer: Self-pay | Admitting: Advanced Practice Midwife

## 2021-02-21 DIAGNOSIS — K831 Obstruction of bile duct: Secondary | ICD-10-CM

## 2021-02-21 DIAGNOSIS — O26613 Liver and biliary tract disorders in pregnancy, third trimester: Secondary | ICD-10-CM

## 2021-02-21 LAB — COMPREHENSIVE METABOLIC PANEL
ALT: 102 IU/L — ABNORMAL HIGH (ref 0–32)
AST: 78 IU/L — ABNORMAL HIGH (ref 0–40)
Albumin/Globulin Ratio: 1.5 (ref 1.2–2.2)
Albumin: 3.5 g/dL — ABNORMAL LOW (ref 3.9–5.0)
Alkaline Phosphatase: 142 IU/L — ABNORMAL HIGH (ref 44–121)
BUN/Creatinine Ratio: 14 (ref 9–23)
BUN: 7 mg/dL (ref 6–20)
Bilirubin Total: 0.6 mg/dL (ref 0.0–1.2)
CO2: 22 mmol/L (ref 20–29)
Calcium: 8.4 mg/dL — ABNORMAL LOW (ref 8.7–10.2)
Chloride: 103 mmol/L (ref 96–106)
Creatinine, Ser: 0.49 mg/dL — ABNORMAL LOW (ref 0.57–1.00)
Globulin, Total: 2.4 g/dL (ref 1.5–4.5)
Glucose: 122 mg/dL — ABNORMAL HIGH (ref 65–99)
Potassium: 3.6 mmol/L (ref 3.5–5.2)
Sodium: 140 mmol/L (ref 134–144)
Total Protein: 5.9 g/dL — ABNORMAL LOW (ref 6.0–8.5)
eGFR: 135 mL/min/{1.73_m2} (ref >59)

## 2021-02-21 LAB — BILE ACIDS, TOTAL: Bile Acids Total: 42.7 umol/L (ref 0.0–10.0)

## 2021-02-21 LAB — URINE CULTURE, OB REFLEX

## 2021-02-21 LAB — CULTURE, OB URINE

## 2021-02-21 NOTE — Telephone Encounter (Signed)
Consult Dr Debroah Loop about pt lab results, elevated Bile Acids/liver enzymes.    Called patient to discuss lab results from 02/19/21.  Pt Bile acids and liver enzymes are elevated, consistent with cholestasis of pregnancy.  Pt had cholestasis with her previous pregnancy and was scheduled for IOL but delivered preterm at 35 weeks with spontaneous onset of labor.  IOL for pt with this pregnancy is recommended on 03/21/21, at [redacted]w[redacted]d.  Pt states understanding and agrees with plan of care.  She needs letter for her work with recommended delivery date, which was provided via MyChart.  Rx for Actigall sent to pharmacy. Pt has Vistaril Rx given on 02/19/21.    Pt to start antenatal testing this week, called [facility] to schedule  Discussed fetal kick counts/reasons to seek care  Keep scheduled prenatal appts at [facility]

## 2021-02-22 ENCOUNTER — Other Ambulatory Visit: Payer: Self-pay

## 2021-02-22 ENCOUNTER — Ambulatory Visit (INDEPENDENT_AMBULATORY_CARE_PROVIDER_SITE_OTHER): Payer: Medicaid Other

## 2021-02-22 ENCOUNTER — Ambulatory Visit: Payer: Medicaid Other | Admitting: *Deleted

## 2021-02-22 ENCOUNTER — Encounter: Payer: Self-pay | Admitting: *Deleted

## 2021-02-22 ENCOUNTER — Ambulatory Visit: Payer: Medicaid Other | Attending: Maternal & Fetal Medicine

## 2021-02-22 DIAGNOSIS — K831 Obstruction of bile duct: Secondary | ICD-10-CM | POA: Diagnosis present

## 2021-02-22 DIAGNOSIS — Z8751 Personal history of pre-term labor: Secondary | ICD-10-CM

## 2021-02-22 DIAGNOSIS — Z3480 Encounter for supervision of other normal pregnancy, unspecified trimester: Secondary | ICD-10-CM | POA: Insufficient documentation

## 2021-02-22 DIAGNOSIS — O26613 Liver and biliary tract disorders in pregnancy, third trimester: Secondary | ICD-10-CM | POA: Insufficient documentation

## 2021-02-22 DIAGNOSIS — O09899 Supervision of other high risk pregnancies, unspecified trimester: Secondary | ICD-10-CM

## 2021-02-22 NOTE — Progress Notes (Signed)
I have reviewed this chart and agree with the RN/CMA assessment and management.    K. Meryl Tramane Gorum, MD, FACOG Attending [facility])  

## 2021-02-22 NOTE — Progress Notes (Signed)
Pt is in the office for 17-p injection, administered in L arm and pt tolerated well. .. Administrations This Visit    HYDROXYprogesterone caproate (Makena) autoinjector 275 mg    Admin Date 02/22/2021 Action Given Dose 275 mg Route Subcutaneous Administered By Katrina Stack, RN

## 2021-02-26 ENCOUNTER — Inpatient Hospital Stay (HOSPITAL_BASED_OUTPATIENT_CLINIC_OR_DEPARTMENT_OTHER): Payer: Medicaid Other

## 2021-02-26 ENCOUNTER — Inpatient Hospital Stay (HOSPITAL_COMMUNITY)
Admission: AD | Admit: 2021-02-26 | Discharge: 2021-02-26 | Disposition: A | Discharge status: Home / Self Care | Payer: Medicaid Other | Attending: Family Medicine | Admitting: Family Medicine

## 2021-02-26 ENCOUNTER — Other Ambulatory Visit: Payer: Self-pay

## 2021-02-26 ENCOUNTER — Encounter (HOSPITAL_COMMUNITY): Payer: Self-pay | Admitting: Family Medicine

## 2021-02-26 DIAGNOSIS — O26613 Liver and biliary tract disorders in pregnancy, third trimester: Secondary | ICD-10-CM | POA: Diagnosis not present

## 2021-02-26 DIAGNOSIS — R109 Unspecified abdominal pain: Secondary | ICD-10-CM

## 2021-02-26 DIAGNOSIS — O4703 False labor before 37 completed weeks of gestation, third trimester: Secondary | ICD-10-CM | POA: Diagnosis not present

## 2021-02-26 DIAGNOSIS — Z3689 Encounter for other specified antenatal screening: Secondary | ICD-10-CM

## 2021-02-26 DIAGNOSIS — O26649 Intrahepatic cholestasis of pregnancy, unspecified trimester: Secondary | ICD-10-CM

## 2021-02-26 DIAGNOSIS — K831 Obstruction of bile duct: Secondary | ICD-10-CM | POA: Diagnosis not present

## 2021-02-26 DIAGNOSIS — Z3A33 33 weeks gestation of pregnancy: Secondary | ICD-10-CM

## 2021-02-26 DIAGNOSIS — O26893 Other specified pregnancy related conditions, third trimester: Secondary | ICD-10-CM | POA: Diagnosis present

## 2021-02-26 DIAGNOSIS — R102 Pelvic and perineal pain: Secondary | ICD-10-CM

## 2021-02-26 DIAGNOSIS — O26619 Liver and biliary tract disorders in pregnancy, unspecified trimester: Secondary | ICD-10-CM

## 2021-02-26 LAB — COMPREHENSIVE METABOLIC PANEL
ALT: 116 U/L — ABNORMAL HIGH (ref 0–44)
AST: 71 U/L — ABNORMAL HIGH (ref 15–41)
Albumin: 2.8 g/dL — ABNORMAL LOW (ref 3.5–5.0)
Alkaline Phosphatase: 146 U/L — ABNORMAL HIGH (ref 38–126)
Anion gap: 7 (ref 5–15)
BUN: 6 mg/dL (ref 6–20)
CO2: 25 mmol/L (ref 22–32)
Calcium: 8.5 mg/dL — ABNORMAL LOW (ref 8.9–10.3)
Chloride: 104 mmol/L (ref 98–111)
Creatinine, Ser: 0.38 mg/dL — ABNORMAL LOW (ref 0.44–1.00)
GFR, Estimated: >60 mL/min (ref >60)
Glucose, Bld: 79 mg/dL (ref 70–99)
Potassium: 3.8 mmol/L (ref 3.5–5.1)
Sodium: 136 mmol/L (ref 135–145)
Total Bilirubin: 0.7 mg/dL (ref 0.3–1.2)
Total Protein: 5.7 g/dL — ABNORMAL LOW (ref 6.5–8.1)

## 2021-02-26 LAB — CBC
HCT: 32.2 % — ABNORMAL LOW (ref 36.0–46.0)
Hemoglobin: 10.4 g/dL — ABNORMAL LOW (ref 12.0–15.0)
MCH: 26.9 pg (ref 26.0–34.0)
MCHC: 32.3 g/dL (ref 30.0–36.0)
MCV: 83.4 fL (ref 80.0–100.0)
Platelets: 183 10*3/uL (ref 150–400)
RBC: 3.86 MIL/uL — ABNORMAL LOW (ref 3.87–5.11)
RDW: 13 % (ref 11.5–15.5)
WBC: 8.8 10*3/uL (ref 4.0–10.5)
nRBC: 0 % (ref 0.0–0.2)

## 2021-02-26 LAB — WET PREP, GENITAL
Clue Cells Wet Prep HPF POC: NONE SEEN
Sperm: NONE SEEN
Trich, Wet Prep: NONE SEEN
Yeast Wet Prep HPF POC: NONE SEEN

## 2021-02-26 LAB — URINALYSIS, ROUTINE W REFLEX MICROSCOPIC
Bilirubin Urine: NEGATIVE
Glucose, UA: NEGATIVE mg/dL
Hgb urine dipstick: NEGATIVE
Ketones, ur: NEGATIVE mg/dL
Leukocytes,Ua: NEGATIVE
Nitrite: NEGATIVE
Protein, ur: NEGATIVE mg/dL
Specific Gravity, Urine: 1.018 (ref 1.005–1.030)
pH: 6 (ref 5.0–8.0)

## 2021-02-26 LAB — LIPASE, BLOOD: Lipase: 33 U/L (ref 11–51)

## 2021-02-26 MED ORDER — LACTATED RINGERS IV BOLUS
1000.0000 mL | Freq: Once | INTRAVENOUS | Status: AC
  Administered 2021-02-26: 1000 mL via INTRAVENOUS

## 2021-02-26 MED ORDER — ACETAMINOPHEN 325 MG PO TABS: 650.0000 mg | ORAL_TABLET | ORAL | 0 refills | Status: AC | PRN

## 2021-02-26 MED ORDER — ACETAMINOPHEN 325 MG PO TABS
650.0000 mg | ORAL_TABLET | Freq: Once | ORAL | Status: AC
  Administered 2021-02-26: 650 mg via ORAL
  Filled 2021-02-26: qty 2

## 2021-02-26 MED ORDER — CYCLOBENZAPRINE HCL 10 MG PO TABS: 10.0000 mg | ORAL_TABLET | Freq: Two times a day (BID) | ORAL | 0 refills | Status: DC | PRN

## 2021-02-26 MED ORDER — NIFEDIPINE 10 MG PO CAPS
10.0000 mg | ORAL_CAPSULE | ORAL | Status: DC | PRN
  Administered 2021-02-26 (×2): 10 mg via ORAL
  Filled 2021-02-26 (×2): qty 1

## 2021-02-26 NOTE — Discharge Instructions (Signed)
Abdominal Pain During Pregnancy Belly (abdominal) pain is common during pregnancy. There are many possible causes. Some causes are more serious than others. Sometimes the cause is not known. Always tell your doctor if you have belly pain. Follow these instructions at home:  Do not have sex or put anything in your vagina until your pain goes away completely.  Get plenty of rest until your pain gets better.  Drink enough fluid to keep your pee (urine) pale yellow.  Take over-the-counter and prescription medicines only as told by your doctor.  Keep all follow-up visits.   Contact a doctor if:  You keep having pain after resting.  Your pain gets worse after resting.  You have lower belly pain that: ? Comes and goes at regular times. ? Spreads to your back. ? Feels like menstrual cramps.  You have pain or burning when you pee (urinate). Get help right away if:  You have a fever or chills.  You feel like it is hard to breathe.  You have bleeding from your vagina.  You are leaking fluid or tissue from your vagina.  You vomit for more than 24 hours.  You have watery poop (diarrhea) for more than 24 hours.  Your baby is moving less than usual.  You feel very weak or faint.  You have very bad pain in your upper belly. Summary  Belly pain is common during pregnancy. There are many possible causes.  If you have belly pain during pregnancy, tell your doctor right away.  Keep all follow-up visits. This information is not intended to replace advice given to you by your health care provider. Make sure you discuss any questions you have with your health care provider. Document Revised: 07/11/2020 Document Reviewed: 07/11/2020 Elsevier Patient Education  2021 Elsevier Inc.  

## 2021-02-26 NOTE — MAU Note (Addendum)
Pt reports she has been having a constant cramp for the last hour and half. Pt reports with movement the cramp gets tighter.  Abdomen is currently soft upon RN palpation.   Denies vaginal bleeding, Denies LOF.   Reports +FM

## 2021-02-26 NOTE — MAU Provider Note (Addendum)
History     CSN: [id number]  Arrival date and time: 02/26/21 1259   Event Date/Time   First Provider Initiated Contact with Patient 02/26/21 1323      Chief Complaint  Patient presents with  . Abdominal Pain   HPI  Karina [name] is a 25 y.o. G2P0101 at 61w5dwho presents to MAU with chief complaints of RUQ pain and lower abdominal cramping. Both complaints are new, onset this morning.   RUQ pain Patient's pain is 8/10 and "constant". Her pain does not radiate. She denies aggravating or alleviating factors. She has not taken medication or tried other treatments for this complaint.   Suprapubic cramping Patients pain is "mild", "crampy" and "uncomfortable but not painful". She denies aggravating or alleviating factors.   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 She states she has been given sedentary tasks. She denies physically rigorous activity. She also denies vaginal bleeding, leaking of fluid, decreased fetal movement, fever, falls, or recent illness.   Patient receives care with [facility] Her ob history includes Cholestasis in her current pregnancy and history of preterm birth at 325w6d. She is receiving Makena injections.  OB History    Gravida  2   Para  1   Term      Preterm  1   AB      Living  1     SAB      IAB      Ectopic      Multiple  0   Live Births  1           Past Medical History:  Diagnosis Date  . Preterm labor     Past Surgical History:  Procedure Laterality Date  . NO PAST SURGERIES      Family History  Problem Relation Age of Onset  . Healthy Mother   . Hyperlipidemia Father   . Diabetes Father     Social History   Tobacco Use  . Smoking status: Never Smoker  . Smokeless tobacco: Never Used  Vaping Use  . Vaping Use: Never used  Substance Use Topics  . Alcohol use: No  . Drug use: No    Types: Marijuana    Comment: prior to first preg     Allergies: No Known Allergies  Facility-Administered Medications Prior to Admission  Medication Dose Route Frequency Provider Last Rate Last Admin  . HYDROXYprogesterone caproate (Makena) autoinjector 275 mg  275 mg Subcutaneous Weekly Constant, Peggy, MD   275 mg at 02/22/21 1321   Medications Prior to Admission  Medication Sig Dispense Refill Last Dose  . HYDROXYprogesterone caproate (MAKENA) autoinjector Inject 275 mg into the skin every 7 (seven) days.   Past Week at Unknown time  . PRENATAL 28-0.8 MG TABS Take 1 tablet by mouth daily. 30 each 12 02/26/2021 at Unknown time  . Blood Pressure Monitoring (BLOOD PRESSURE KIT) DEVI 1 kit by Does not apply route once a week. Check Blood Pressure regularly and record readings into the Bab[employer] App.  Large Cuff.  DX O90.0 1 each 0   . hydrOXYzine (VISTARIL) 25 MG capsule Take 1 capsule (25 mg total) by mouth 3 (three) times daily as needed. 30 capsule 0     Review of Systems  Gastrointestinal: Positive for abdominal pain.  All other systems reviewed and are negative.  Physical Exam   Blood pressure 108/65, pulse 96, temperature 98.7 F (37.1  C), resp. rate 16, weight 46.7 kg, last menstrual period 07/05/2020, SpO2 100 %.  Physical Exam Vitals and nursing note reviewed. Exam conducted with a chaperone present.  Constitutional:      Appearance: She is well-developed. She is not ill-appearing.     Comments: Appears uncomfortable at rest on MAU ob stretcher  Cardiovascular:     Rate and Rhythm: Normal rate.     Heart sounds: Normal heart sounds.  Pulmonary:     Effort: Pulmonary effort is normal.     Breath sounds: Normal breath sounds.  Abdominal:     Palpations: Abdomen is soft.     Tenderness: There is abdominal tenderness in the right upper quadrant. There is no right CVA tenderness, left CVA tenderness, guarding or rebound.  Skin:    General: Skin is warm and dry.     Capillary Refill: Capillary refill takes less than 2  seconds.  Neurological:     Mental Status: She is alert and oriented to person, place, and time.     MAU Course  Procedures  --Pain resolved with medications given in MAU --Reactive tracing: baseline 135, mod var, + accels, no decels --Toco: irregular mild contractions, resolved with fluid bolus and Procardia x 2 --Patient declines recheck of cervix, pain score 0/10 --Liver enzymes not significantly changed from one week ago --Reviewed with Dr. Nehemiah Settle, no additional intervention indicated at this time  Orders Placed This Encounter  Procedures  . Wet prep, genital  . [place] MFM OB Limited  . Urinalysis, Routine w reflex microscopic Urine, Clean Catch  . CBC  . Lipase, blood  . Comprehensive metabolic panel  . Insert peripheral IV  . Discharge patient   Patient Vitals for the past 24 hrs:  BP Temp Pulse Resp SpO2 Weight  02/26/21 1444 (!) 103/54 -- (!) 111 -- -- --  02/26/21 1421 114/78 -- -- -- 100 % --  02/26/21 1420 114/78 -- 88 -- -- --  02/26/21 1353 108/65 -- -- -- -- --  02/26/21 1318 114/69 -- 96 -- -- --  02/26/21 1317 -- 98.7 F (37.1 C) -- 16 100 % --  02/26/21 1309 -- -- -- -- -- 46.7 kg   Results for orders placed or performed during the hospital encounter of 02/26/21 (from the past 24 hour(s))  Urinalysis, Routine w reflex microscopic Urine, Clean Catch     Status: Abnormal   Collection Time: 02/26/21  1:31 PM  Result Value Ref Range   Color, Urine AMBER (A) YELLOW   APPearance CLEAR CLEAR   Specific Gravity, Urine 1.018 1.005 - 1.030   pH 6.0 5.0 - 8.0   Glucose, UA NEGATIVE NEGATIVE mg/dL   Hgb urine dipstick NEGATIVE NEGATIVE   Bilirubin Urine NEGATIVE NEGATIVE   Ketones, ur NEGATIVE NEGATIVE mg/dL   Protein, ur NEGATIVE NEGATIVE mg/dL   Nitrite NEGATIVE NEGATIVE   Leukocytes,Ua NEGATIVE NEGATIVE  Wet prep, genital     Status: Abnormal   Collection Time: 02/26/21  1:35 PM  Result Value Ref Range   Yeast Wet Prep HPF POC NONE SEEN NONE SEEN   Trich,  Wet Prep NONE SEEN NONE SEEN   Clue Cells Wet Prep HPF POC NONE SEEN NONE SEEN   WBC, Wet Prep HPF POC MANY (A) NONE SEEN   Sperm NONE SEEN   CBC     Status: Abnormal   Collection Time: 02/26/21  2:01 PM  Result Value Ref Range   WBC 8.8 4.0 - 10.5 K/uL  RBC 3.86 (L) 3.87 - 5.11 MIL/uL   Hemoglobin 10.4 (L) 12.0 - 15.0 g/dL   HCT 32.2 (L) 36.0 - 46.0 %   MCV 83.4 80.0 - 100.0 fL   MCH 26.9 26.0 - 34.0 pg   MCHC 32.3 30.0 - 36.0 g/dL   RDW 13.0 11.5 - 15.5 %   Platelets 183 150 - 400 K/uL   nRBC 0.0 0.0 - 0.2 %  Lipase, blood     Status: None   Collection Time: 02/26/21  2:01 PM  Result Value Ref Range   Lipase 33 11 - 51 U/L  Comprehensive metabolic panel     Status: Abnormal   Collection Time: 02/26/21  2:01 PM  Result Value Ref Range   Sodium 136 135 - 145 mmol/L   Potassium 3.8 3.5 - 5.1 mmol/L   Chloride 104 98 - 111 mmol/L   CO2 25 22 - 32 mmol/L   Glucose, Bld 79 70 - 99 mg/dL   BUN 6 6 - 20 mg/dL   Creatinine, Ser 0.38 (L) 0.44 - 1.00 mg/dL   Calcium 8.5 (L) 8.9 - 10.3 mg/dL   Total Protein 5.7 (L) 6.5 - 8.1 g/dL   Albumin 2.8 (L) 3.5 - 5.0 g/dL   AST 71 (H) 15 - 41 U/L   ALT 116 (H) 0 - 44 U/L   Alkaline Phosphatase 146 (H) 38 - 126 U/L   Total Bilirubin 0.7 0.3 - 1.2 mg/dL   GFR, Estimated >60 >60 mL/min   Anion gap 7 5 - 15   Meds ordered this encounter  Medications  . acetaminophen (TYLENOL) tablet 650 mg  . NIFEdipine (PROCARDIA) capsule 10 mg  . lactated ringers bolus 1,000 mL  . acetaminophen (TYLENOL) 325 MG tablet    Sig: Take 2 tablets (650 mg total) by mouth every 4 (four) hours as needed.    Dispense:  180 tablet    Refill:  0    Order Specific Question:   Supervising Provider    Answer:   Truett Mainland [[id number]]  . cyclobenzaprine (FLEXERIL) 10 MG tablet    Sig: Take 1 tablet (10 mg total) by mouth 2 (two) times daily as needed for muscle spasms.    Dispense:  20 tablet    Refill:  0    Order Specific Question:   Supervising Provider     Answer:   Truett Mainland [[id number]]   Assessment and Plan  --25 y.o. G2P0101 at [redacted]w[redacted]d --Previously diagnosed Cholestasis of Pregnancy --Preterm contractions, closed cervix --Reactive tracing --No concerning findings on OB MFM Limited [place]--Pain resolved with medications given in MAU --Advised maternity belt during work shifts, even if sedentary work --Discharge home in stable condition  F/U --BPP 04/20 --Next OB appt 04/22  SDarlina Rumpf CHobbs4/18/2022, 5:08 PM

## 2021-02-27 LAB — GC/CHLAMYDIA PROBE AMP (~~LOC~~) NOT AT ARMC
Chlamydia: NEGATIVE
Comment: NEGATIVE
Comment: NORMAL
Neisseria Gonorrhea: NEGATIVE

## 2021-02-28 ENCOUNTER — Ambulatory Visit: Payer: Medicaid Other | Admitting: *Deleted

## 2021-02-28 ENCOUNTER — Ambulatory Visit (INDEPENDENT_AMBULATORY_CARE_PROVIDER_SITE_OTHER): Payer: Medicaid Other

## 2021-02-28 VITALS — BP 105/65 | HR 104 | Wt 104.1 lb

## 2021-02-28 DIAGNOSIS — O26613 Liver and biliary tract disorders in pregnancy, third trimester: Secondary | ICD-10-CM | POA: Diagnosis not present

## 2021-02-28 DIAGNOSIS — K831 Obstruction of bile duct: Secondary | ICD-10-CM

## 2021-02-28 MED ORDER — URSODIOL 500 MG PO TABS: 500.0000 mg | ORAL_TABLET | Freq: Two times a day (BID) | ORAL | 0 refills | Status: DC

## 2021-02-28 NOTE — Progress Notes (Signed)
Pt informed that the ultrasound is considered a limited OB ultrasound and is not intended to be a complete ultrasound exam.  Patient also informed that the ultrasound is not being completed with the intent of assessing for fetal or placental anomalies or any pelvic abnormalities.  Explained that the purpose of today's ultrasound is to assess for presentation, BPP and amniotic fluid volume.  Patient acknowledges the purpose of the exam and the limitations of the study.    Per consult w/Dr. Adrian Blackwater, Rx for Ursodiol was e-prescribed to pharmacy. Per guideline, pt will likely be scheduled for IOL @ [redacted] wks EGA.

## 2021-03-02 ENCOUNTER — Other Ambulatory Visit: Payer: Self-pay

## 2021-03-02 ENCOUNTER — Ambulatory Visit (INDEPENDENT_AMBULATORY_CARE_PROVIDER_SITE_OTHER): Payer: Medicaid Other | Admitting: Obstetrics & Gynecology

## 2021-03-02 VITALS — BP 102/68 | HR 99 | Wt 104.0 lb

## 2021-03-02 DIAGNOSIS — Z8751 Personal history of pre-term labor: Secondary | ICD-10-CM

## 2021-03-02 DIAGNOSIS — Z3A34 34 weeks gestation of pregnancy: Secondary | ICD-10-CM

## 2021-03-02 DIAGNOSIS — O09899 Supervision of other high risk pregnancies, unspecified trimester: Secondary | ICD-10-CM

## 2021-03-02 DIAGNOSIS — O26613 Liver and biliary tract disorders in pregnancy, third trimester: Secondary | ICD-10-CM

## 2021-03-02 DIAGNOSIS — O09893 Supervision of other high risk pregnancies, third trimester: Secondary | ICD-10-CM

## 2021-03-02 DIAGNOSIS — O0993 Supervision of high risk pregnancy, unspecified, third trimester: Secondary | ICD-10-CM

## 2021-03-02 DIAGNOSIS — K831 Obstruction of bile duct: Secondary | ICD-10-CM

## 2021-03-02 MED ORDER — HYDROXYPROGESTERONE CAPROATE 275 MG/1.1ML ~~LOC~~ SOAJ
275.0000 mg | Freq: Once | SUBCUTANEOUS | Status: AC
  Administered 2021-03-02: 275 mg via SUBCUTANEOUS

## 2021-03-02 NOTE — Progress Notes (Signed)
PRENATAL VISIT NOTE  Subjective:  Karina [name] is a 25 y.o. G2P0101 at [redacted]w[redacted]d being seen today for ongoing prenatal care.  She is currently monitored for the following issues for this high-risk pregnancy and has Supervision of high-risk pregnancy; History of preterm delivery, currently pregnant; and Cholestasis of pregnancy on their problem list.  Patient reports no complaints.  Contractions: Irritability. Vag. Bleeding: None.  Movement: Present. Denies leaking of fluid.   The following portions of the patient's history were reviewed and updated as appropriate: allergies, current medications, past family history, past medical history, past social history, past surgical history and problem list.   Objective:   Vitals:   03/02/21 1035  BP: 102/68  Pulse: 99  Weight: 104 lb (47.2 kg)    Fetal Status: Fetal Heart Rate (bpm): 154   Movement: Present     General:  Alert, oriented and cooperative. Patient is in no acute distress.  Skin: Skin is warm and dry. No rash noted.   Cardiovascular: Normal heart rate noted  Respiratory: Normal respiratory effort, no problems with respiration noted  Abdomen: Soft, gravid, appropriate for gestational age.  Pain/Pressure: Absent     Pelvic: Cervical exam deferred        Extremities: Normal range of motion.  Edema: None  Mental Status: Normal mood and affect. Normal behavior. Normal judgment and thought content.    Imaging: [place] MFM FETAL BPP WO NON STRESS  Result Date: 02/22/2021 ----------------------------------------------------------------------  OBSTETRICS REPORT                       (Signed Final 02/22/2021 09:04 am) ---------------------------------------------------------------------- Patient Info  ID #:       [id number]                          D.O.B.:  1996-03-21 (24 yrs)  Name:       Karina [name]             Visit Date: 02/22/2021 08:19 am ---------------------------------------------------------------------- Performed By   Attending:        Lin Landsman      Ref. Address:     [address]                    MD                                                             [address]                                                             [place], [place]                                                             [address]  Performed By:     Fayne Norrie BS,  [facility]                                [facility]  Referred By:      Adam Phenix                    MD ---------------------------------------------------------------------- Orders  #  Description                           Code        Ordered By  1  [place] MFM FETAL BPP WO NON               76819.01    LISA LEFTWICH-     STRESS                                            KIRBY ----------------------------------------------------------------------  #  Order #                     Accession #                Episode #  1  [id number]                   [id number]                 [id number] ---------------------------------------------------------------------- Indications  Prior poor obstetrical history antepartum,     O09.293  third trimester (pre-term birth)  Encounter for antenatal screening for          Z36.3  malformations (LR NIPS)  [redacted] weeks gestation of pregnancy                Z3A.33  Cholelithiasis affecting pregnancy,            O26.619, K80.20  antepartum ---------------------------------------------------------------------- Fetal Evaluation  Num Of Fetuses:         1  Fetal Heart Rate(bpm):  129  Cardiac Activity:       Observed  Presentation:           Cephalic  Placenta:               Anterior  P. Cord Insertion:      Previously Visualized  Amniotic Fluid  AFI FV:      Within normal limits  AFI Sum(cm)     %Tile  Largest Pocket(cm)  8.85            9           4.47                 RLQ(cm)       LUQ(cm)        LLQ(cm)                4.47          2.19           2.19 ---------------------------------------------------------------------- Biophysical Evaluation  Amniotic F.V:   Pocket => 2 cm             F. Tone:        Observed  F. Movement:    Observed                   Score:          8/8  F. Breathing:   Observed ---------------------------------------------------------------------- Biometry  LV:        4.8  mm ---------------------------------------------------------------------- OB History  Gravidity:    2  Living:       1 ---------------------------------------------------------------------- Gestational Age  LMP:           33w 1d        Date:  07/05/20                 EDD:   04/11/21  Best:          33w 1d     Det. By:  LMP  (07/05/20)          EDD:   04/11/21 ---------------------------------------------------------------------- Anatomy  Ventricles:            Appears normal         Stomach:                Appears normal, left                                                                        sided  Heart:                 Appears normal         Cord Vessels:           Appears normal (3                         (4CH, axis, and                                vessel cord)                         situs)  LVOT:                  Appears normal         Kidneys:                Appear normal  Diaphragm:             Appears normal  Bladder:                Appears normal ---------------------------------------------------------------------- Cervix Uterus Adnexa  Cervix  Not visualized (advanced GA >24wks) ---------------------------------------------------------------------- Impression  Antenatal testing performed given maternal cholestasis of  pregnancy  The biophysical profile was 8/8 with good fetal movement and  amniotic fluid volume.  She is doing well. Her itching has improved. She reports  good fetal movement.  I discussed fetal kick counts.  ---------------------------------------------------------------------- Recommendations  Continue weekly testing at your offices.  Delivery by 37-38 weeks. ----------------------------------------------------------------------               Lin Landsman, MD Electronically Signed Final Report   02/22/2021 09:04 am ----------------------------------------------------------------------  [place] MFM OB Limited  Result Date: 02/26/2021 ----------------------------------------------------------------------  OBSTETRICS REPORT                       (Signed Final 02/26/2021 05:59 pm) ---------------------------------------------------------------------- Patient Info  ID #:       [id number]                          D.O.B.:  November 29, 1995 (24 yrs)  Name:       Karina Chestnut Lasecki             Visit Date: 02/26/2021 02:44 pm ---------------------------------------------------------------------- Performed By  Attending:        Ma Rings MD         Secondary Phy.:   [facility] MAU/Triage  Performed By:     Marcellina Millin          [facility]  Referred By:      Calvert Cantor ---------------------------------------------------------------------- Orders  #  Description                           Code        Ordered By  1  [place] MFM OB LIMITED                     11914.78    Clayton Bibles ----------------------------------------------------------------------  #  Order #                     Accession #                Episode #  1  [id number]                   [id number]                 [id number] ---------------------------------------------------------------------- Indications  Pelvic pain affecting pregnancy  in third       O26.893  trimester  [redacted] weeks gestation of pregnancy                Z3A.33 ---------------------------------------------------------------------- Fetal  Evaluation  Num Of Fetuses:         1  Fetal Heart Rate(bpm):  169  Cardiac Activity:       Observed  Presentation:           Cephalic  Placenta:               Anterior  P. Cord Insertion:      Visualized  Amniotic Fluid  AFI FV:      Within normal limits  AFI Sum(cm)     %Tile       Largest Pocket(cm)  10.98           26          3.3  RUQ(cm)       RLQ(cm)       LUQ(cm)        LLQ(cm)  2.48          2.11          3.3            3.09  Comment:    No placental abruption or previa identified. ---------------------------------------------------------------------- OB History  Gravidity:    2  Living:       1 ---------------------------------------------------------------------- Gestational Age  LMP:           33w 5d        Date:  07/05/20                 EDD:   04/11/21  Best:          33w 5d     Det. By:  LMP  (07/05/20)          EDD:   04/11/21 ---------------------------------------------------------------------- Anatomy  Stomach:               Appears normal, left   Bladder:                Appears normal                         sided ---------------------------------------------------------------------- Cervix Uterus Adnexa  Cervix  Not visualized (advanced GA >24wks)  Uterus  No abnormality visualized.  Right Ovary  Within normal limits.  Left Ovary  Within normal limits.  Adnexa  No abnormality visualized. ---------------------------------------------------------------------- Comments  This patient presented to the MAU due to right upper  quadrant pain.  A limited ultrasound performed today shows that the fetus is  in the vertex presentation.  There was normal amniotic fluid noted.  A normal-appearing anterior placenta is noted. ----------------------------------------------------------------------                   Ma Rings, MD Electronically Signed Final Report   02/26/2021 05:59 pm ----------------------------------------------------------------------  US FETAL BPP W/NONSTRESS  Result Date:  02/28/2021 ----------------------------------------------------------------------  OBSTETRICS REPORT                       (Signed Final 02/28/2021 04:49 pm) ---------------------------------------------------------------------- Patient Info  ID #:       [id number]                          D.O.B.:  09-Oct-1996 (24 yrs)  [facility]  [facility]             Visit Date: 02/28/2021 04:24 pm ---------------------------------------------------------------------- Performed By  Attending:        Candelaria CelesteJacob Stinson DO       Secondary Phy.:   [facility]  Performed By:     Sedalia Mutaiane Day RNC          [facility]  Referred By:      Calvert CantorSAMANTHA C                    WEINHOLD ---------------------------------------------------------------------- Orders  #  Description                           Code        Ordered By  1  US FETAL BPP W/NONSTRESS              40981.176818.4     Candelaria CelesteJACOB STINSON ----------------------------------------------------------------------  #  Order #                     Accession #                Episode #  1  [id number]                   [id number]                 [id number] ---------------------------------------------------------------------- Service(s) Provided  US Fetal BPP W NST                                    [phone] ---------------------------------------------------------------------- Indications  [redacted] weeks gestation of pregnancy                Z3A.34  Cholestasis of pregnancy, third trimester      W10.272Z36.6O26.613K83.1 ---------------------------------------------------------------------- Fetal Evaluation  Num Of Fetuses:  1  Preg. Location:         Intrauterine  Cardiac Activity:       Observed  Presentation:            Cephalic  Amniotic Fluid  AFI FV:      Within normal limits  AFI Sum(cm)     %Tile       Largest Pocket(cm)  13.1            42          4.1  RUQ(cm)       RLQ(cm)       LUQ(cm)        LLQ(cm)  3.6           3             4.1            2.4 ---------------------------------------------------------------------- Biophysical Evaluation  Amniotic F.V:   Pocket => 2 cm             F. Tone:        Observed  F. Movement:    Observed                   N.S.T:          Reactive  F. Breathing:   Observed                   Score:          10/10 ---------------------------------------------------------------------- OB History  Gravidity:    2  Living:       1 ---------------------------------------------------------------------- Gestational Age  LMP:           34w 0d        Date:  07/05/20                 EDD:   04/11/21  Best:          34w 0d     Det. By:  LMP  (07/05/20)          EDD:   04/11/21 ---------------------------------------------------------------------- Impression  BPP 10/10 for cholestasis ---------------------------------------------------------------------- Recommendations  Continue antenatal testing. ----------------------------------------------------------------------                   Candelaria Celeste, DO Electronically Signed Final Report   02/28/2021 04:49 pm ----------------------------------------------------------------------   Assessment and Plan:  Pregnancy: G2P0101 at [redacted]w[redacted]d 1. Cholestasis during pregnancy in third trimester Continue Ursodiol and Atarax. Scheduled for weekly antenatal testing.  Growth scan ordered. IOL to be scheduled for 37 weeks. FM precautions strictly emphasized.  - [place] MFM OB FOLLOW UP; Future - [place] MFM FETAL BPP WO NON STRESS; Future  2. History of preterm delivery, currently pregnant Continue weekly 17P injections until 36th week. - HYDROXYprogesterone caproate (Makena) autoinjector 275 mg  3. [redacted] weeks gestation of pregnancy 4. Supervision of high risk pregnancy in  third trimester Preterm labor symptoms and general obstetric precautions including but not limited to vaginal bleeding, contractions, leaking of fluid and fetal movement were reviewed in detail with the patient. Please refer to After Visit Summary for other counseling recommendations.   Return in about 1 week (around 03/09/2021) for 17P, OFFICE OB VISIT (MD only), Pelvic cultures (book IOL at 37 weeks).  Future Appointments  Date Time Provider Department Center  03/07/2021  8:15 AM [facility] NST [facility]  03/09/2021  8:45 AM Marny Lowenstein, PA-C [facility] None  03/14/2021 11:15 AM [facility] NST [facility]  03/14/2021  3:00 PM [facility] NURSE [facility]  03/14/2021  3:15 PM [facility] US2 [facility]    Jaynie Collins, MD

## 2021-03-02 NOTE — Patient Instructions (Signed)
Return to office for any scheduled appointments. Call the office or go to the MAU at [facility] [place] if:  You begin to have strong, frequent contractions  Your water breaks.  Sometimes it is a big gush of fluid, sometimes it is just a trickle that keeps getting your panties wet or running down your legs  You have vaginal bleeding.  It is normal to have a small amount of spotting if your cervix was checked.   You do not feel your baby moving like normal.  If you do not, get something to eat and drink and lay down and focus on feeling your baby move.   If your baby is still not moving like normal, you should call the office or go to MAU.  Any other obstetric concerns.   

## 2021-03-02 NOTE — Progress Notes (Signed)
ROB 34wks2d  17 P today.  CC: None   *Will leave FMLA paper work today . Pt made aware of process and fee.

## 2021-03-07 ENCOUNTER — Other Ambulatory Visit: Payer: Self-pay

## 2021-03-07 ENCOUNTER — Ambulatory Visit: Payer: Medicaid Other | Admitting: *Deleted

## 2021-03-07 ENCOUNTER — Ambulatory Visit (INDEPENDENT_AMBULATORY_CARE_PROVIDER_SITE_OTHER): Payer: Medicaid Other

## 2021-03-07 VITALS — BP 109/64 | HR 87

## 2021-03-07 DIAGNOSIS — O26613 Liver and biliary tract disorders in pregnancy, third trimester: Secondary | ICD-10-CM | POA: Diagnosis not present

## 2021-03-07 DIAGNOSIS — Z3A35 35 weeks gestation of pregnancy: Secondary | ICD-10-CM

## 2021-03-07 DIAGNOSIS — K831 Obstruction of bile duct: Secondary | ICD-10-CM

## 2021-03-07 NOTE — Progress Notes (Signed)
[place] for growth & BPP scheduled on 5/4.

## 2021-03-09 ENCOUNTER — Ambulatory Visit (INDEPENDENT_AMBULATORY_CARE_PROVIDER_SITE_OTHER): Payer: Medicaid Other | Admitting: Medical

## 2021-03-09 ENCOUNTER — Encounter: Payer: Self-pay | Admitting: Medical

## 2021-03-09 ENCOUNTER — Other Ambulatory Visit: Payer: Self-pay

## 2021-03-09 VITALS — BP 108/71 | HR 85 | Wt 106.0 lb

## 2021-03-09 DIAGNOSIS — O09899 Supervision of other high risk pregnancies, unspecified trimester: Secondary | ICD-10-CM | POA: Diagnosis not present

## 2021-03-09 DIAGNOSIS — K831 Obstruction of bile duct: Secondary | ICD-10-CM

## 2021-03-09 DIAGNOSIS — O26613 Liver and biliary tract disorders in pregnancy, third trimester: Secondary | ICD-10-CM

## 2021-03-09 DIAGNOSIS — O0993 Supervision of high risk pregnancy, unspecified, third trimester: Secondary | ICD-10-CM

## 2021-03-09 MED ORDER — HYDROXYPROGESTERONE CAPROATE 275 MG/1.1ML ~~LOC~~ SOAJ
275.0000 mg | Freq: Once | SUBCUTANEOUS | Status: AC
  Administered 2021-03-09: 275 mg via SUBCUTANEOUS

## 2021-03-09 NOTE — Progress Notes (Signed)
ROB [redacted]w[redacted]d 03/07/21 had  BPP/NST   17-P Today  CC: pt notes feeling heavy pressure as if the baby is going to fall out.

## 2021-03-09 NOTE — Progress Notes (Signed)
   PRENATAL VISIT NOTE  Subjective:  Karina [name] is a 25 y.o. G2P0101 at [redacted]w[redacted]d being seen today for ongoing prenatal care.  She is currently monitored for the following issues for this high-risk pregnancy and has Supervision of high-risk pregnancy; History of preterm delivery, currently pregnant; and Cholestasis of pregnancy on their problem list.  Patient reports no complaints.  Contractions: Irritability. Vag. Bleeding: None.  Movement: Present. Denies leaking of fluid.   The following portions of the patient's history were reviewed and updated as appropriate: allergies, current medications, past family history, past medical history, past social history, past surgical history and problem list.   Objective:   Vitals:   03/09/21 0831  BP: 108/71  Pulse: 85  Weight: 106 lb (48.1 kg)    Fetal Status: Fetal Heart Rate (bpm): 135 Fundal Height: 34 cm Movement: Present     General:  Alert, oriented and cooperative. Patient is in no acute distress.  Skin: Skin is warm and dry. No rash noted.   Cardiovascular: Normal heart rate noted  Respiratory: Normal respiratory effort, no problems with respiration noted  Abdomen: Soft, gravid, appropriate for gestational age.  Pain/Pressure: Present     Pelvic: Cervical exam deferred        Extremities: Normal range of motion.  Edema: None  Mental Status: Normal mood and affect. Normal behavior. Normal judgment and thought content.   Assessment and Plan:  Pregnancy: G2P0101 at [redacted]w[redacted]d 1. Supervision of high risk pregnancy in third trimester - Doing well  2. Cholestasis during pregnancy in third trimester - Comp Met (CMET) - BPP/NST 10/10 on 03/07/21 - Scheduled for next [place] on 03/14/21 - IOL will be scheduled today for 37 weeks, orders placed  3. History of preterm delivery, currently pregnant - HYDROXYprogesterone caproate (Makena) autoinjector 275 mg  Preterm labor symptoms and general obstetric precautions including but not limited to  vaginal bleeding, contractions, leaking of fluid and fetal movement were reviewed in detail with the patient. Please refer to After Visit Summary for other counseling recommendations.   Return in about 1 week (around 03/16/2021) for [facility] APP, In-Person.  Future Appointments  Date Time Provider [place]  03/14/2021  3:00 PM [facility] NURSE [facility]  03/14/2021  3:15 PM [facility] US2 [facility]    Kerry Hough, PA-C

## 2021-03-09 NOTE — Addendum Note (Signed)
Addended by: Marny Lowenstein on: 03/09/2021 09:01 AM   Modules accepted: Orders, SmartSet

## 2021-03-09 NOTE — Patient Instructions (Signed)
Fetal Movement Counts Patient Name: ________________________________________________ Patient Due Date: ____________________  What is a fetal movement count? A fetal movement count is the number of times that you feel your baby move during a certain amount of time. This may also be called a fetal kick count. A fetal movement count is recommended for every pregnant woman. You may be asked to start counting fetal movements as early as week 28 of your pregnancy. Pay attention to when your baby is most active. You may notice your baby's sleep and wake cycles. You may also notice things that make your baby move more. You should do a fetal movement count:  When your baby is normally most active.  At the same time each day. A good time to count movements is while you are resting, after having something to eat and drink. How do I count fetal movements? 1. Find a quiet, comfortable area. Sit, or lie down on your side. 2. Write down the date, the start time and stop time, and the number of movements that you felt between those two times. Take this information with you to your health care visits. 3. Write down your start time when you feel the first movement. 4. Count kicks, flutters, swishes, rolls, and jabs. You should feel at least 10 movements. 5. You may stop counting after you have felt 10 movements, or if you have been counting for 2 hours. Write down the stop time. 6. If you do not feel 10 movements in 2 hours, contact your health care provider for further instructions. Your health care provider may want to do additional tests to assess your baby's well-being. Contact a health care provider if:  You feel fewer than 10 movements in 2 hours.  Your baby is not moving like he or she usually does. Date: ____________ Start time: ____________ Stop time: ____________ Movements: ____________ Date: ____________ Start time: ____________ Stop time: ____________ Movements: ____________ Date: ____________  Start time: ____________ Stop time: ____________ Movements: ____________ Date: ____________ Start time: ____________ Stop time: ____________ Movements: ____________ Date: ____________ Start time: ____________ Stop time: ____________ Movements: ____________ Date: ____________ Start time: ____________ Stop time: ____________ Movements: ____________ Date: ____________ Start time: ____________ Stop time: ____________ Movements: ____________ Date: ____________ Start time: ____________ Stop time: ____________ Movements: ____________ Date: ____________ Start time: ____________ Stop time: ____________ Movements: ____________ This information is not intended to replace advice given to you by your health care provider. Make sure you discuss any questions you have with your health care provider. Document Revised: 06/17/2019 Document Reviewed: 06/17/2019 Elsevier Patient Education  2021 Elsevier Inc[employer] Rosen's Emergency Medicine: Concepts and Clinical Practice (9th ed., pp. 2296- 2312). Elsevier.">    Braxton Hicks Contractions Contractions of the uterus can occur throughout pregnancy, but they are not always a sign that you are in labor. You may have practice contractions called Braxton Hicks contractions. These false labor contractions are sometimes confused with true labor. What are Braxton Hicks contractions? Braxton Hicks contractions are tightening movements that occur in the muscles of the uterus before labor. Unlike true labor contractions, these contractions do not result in opening (dilation) and thinning of the cervix. Toward the end of pregnancy (32-34 weeks), Braxton Hicks contractions can happen more often and may become stronger. These contractions are sometimes difficult to tell apart from true labor because they can be very uncomfortable. You should not feel embarrassed if you go to the hospital with false labor. Sometimes, the only way to tell if you are in true labor is   for your health care  provider to look for changes in the cervix. The health care provider will do a physical exam and may monitor your contractions. If you are not in true labor, the exam should show that your cervix is not dilating and your water has not broken. If there are no other health problems associated with your pregnancy, it is completely safe for you to be sent home with false labor. You may continue to have Braxton Hicks contractions until you go into true labor. How to tell the difference between true labor and false labor True labor  Contractions last 30-70 seconds.  Contractions become very regular.  Discomfort is usually felt in the top of the uterus, and it spreads to the lower abdomen and low back.  Contractions do not go away with walking.  Contractions usually become more intense and increase in frequency.  The cervix dilates and gets thinner. False labor  Contractions are usually shorter and not as strong as true labor contractions.  Contractions are usually irregular.  Contractions are often felt in the front of the lower abdomen and in the groin.  Contractions may go away when you walk around or change positions while lying down.  Contractions get weaker and are shorter-lasting as time goes on.  The cervix usually does not dilate or become thin. Follow these instructions at home:  Take over-the-counter and prescription medicines only as told by your health care provider.  Keep up with your usual exercises and follow other instructions from your health care provider.  Eat and drink lightly if you think you are going into labor.  If Braxton Hicks contractions are making you uncomfortable: ? Change your position from lying down or resting to walking, or change from walking to resting. ? Sit and rest in a tub of warm water. ? Drink enough fluid to keep your urine pale yellow. Dehydration may cause these contractions. ? Do slow and deep breathing several times an hour.  Keep  all follow-up prenatal visits as told by your health care provider. This is important.   Contact a health care provider if:  You have a fever.  You have continuous pain in your abdomen. Get help right away if:  Your contractions become stronger, more regular, and closer together.  You have fluid leaking or gushing from your vagina.  You pass blood-tinged mucus (bloody show).  You have bleeding from your vagina.  You have low back pain that you never had before.  You feel your baby's head pushing down and causing pelvic pressure.  Your baby is not moving inside you as much as it used to. Summary  Contractions that occur before labor are called Braxton Hicks contractions, false labor, or practice contractions.  Braxton Hicks contractions are usually shorter, weaker, farther apart, and less regular than true labor contractions. True labor contractions usually become progressively stronger and regular, and they become more frequent.  Manage discomfort from Braxton Hicks contractions by changing position, resting in a warm bath, drinking plenty of water, or practicing deep breathing. This information is not intended to replace advice given to you by your health care provider. Make sure you discuss any questions you have with your health care provider. Document Revised: 10/10/2017 Document Reviewed: 03/13/2017 Elsevier Patient Education  2021 Elsevier Inc.  

## 2021-03-10 LAB — COMPREHENSIVE METABOLIC PANEL
ALT: 147 IU/L — ABNORMAL HIGH (ref 0–32)
AST: 65 IU/L — ABNORMAL HIGH (ref 0–40)
Albumin/Globulin Ratio: 1.2 (ref 1.2–2.2)
Albumin: 3.3 g/dL — ABNORMAL LOW (ref 3.9–5.0)
Alkaline Phosphatase: 191 IU/L — ABNORMAL HIGH (ref 44–121)
BUN/Creatinine Ratio: 17 (ref 9–23)
BUN: 8 mg/dL (ref 6–20)
Bilirubin Total: 0.5 mg/dL (ref 0.0–1.2)
CO2: 20 mmol/L (ref 20–29)
Calcium: 8.7 mg/dL (ref 8.7–10.2)
Chloride: 103 mmol/L (ref 96–106)
Creatinine, Ser: 0.46 mg/dL — ABNORMAL LOW (ref 0.57–1.00)
Globulin, Total: 2.8 g/dL (ref 1.5–4.5)
Glucose: 88 mg/dL (ref 65–99)
Potassium: 3.8 mmol/L (ref 3.5–5.2)
Sodium: 139 mmol/L (ref 134–144)
Total Protein: 6.1 g/dL (ref 6.0–8.5)
eGFR: 137 mL/min/{1.73_m2} (ref >59)

## 2021-03-13 ENCOUNTER — Telehealth (HOSPITAL_COMMUNITY): Payer: Self-pay | Admitting: *Deleted

## 2021-03-13 NOTE — Telephone Encounter (Signed)
Preadmission screen  

## 2021-03-14 ENCOUNTER — Telehealth (HOSPITAL_COMMUNITY): Payer: Self-pay | Admitting: *Deleted

## 2021-03-14 ENCOUNTER — Other Ambulatory Visit: Payer: Self-pay | Admitting: Advanced Practice Midwife

## 2021-03-14 ENCOUNTER — Encounter: Payer: Self-pay | Admitting: *Deleted

## 2021-03-14 ENCOUNTER — Other Ambulatory Visit: Payer: Medicaid Other

## 2021-03-14 ENCOUNTER — Ambulatory Visit: Payer: Medicaid Other | Admitting: *Deleted

## 2021-03-14 ENCOUNTER — Other Ambulatory Visit: Payer: Self-pay

## 2021-03-14 ENCOUNTER — Ambulatory Visit: Payer: Medicaid Other | Attending: Obstetrics & Gynecology

## 2021-03-14 ENCOUNTER — Encounter (HOSPITAL_COMMUNITY): Payer: Self-pay | Admitting: *Deleted

## 2021-03-14 VITALS — BP 113/71 | HR 109

## 2021-03-14 DIAGNOSIS — Z3A36 36 weeks gestation of pregnancy: Secondary | ICD-10-CM

## 2021-03-14 DIAGNOSIS — K831 Obstruction of bile duct: Secondary | ICD-10-CM

## 2021-03-14 DIAGNOSIS — O26613 Liver and biliary tract disorders in pregnancy, third trimester: Secondary | ICD-10-CM | POA: Diagnosis not present

## 2021-03-14 NOTE — Telephone Encounter (Signed)
Preadmission screen  

## 2021-03-16 ENCOUNTER — Ambulatory Visit (INDEPENDENT_AMBULATORY_CARE_PROVIDER_SITE_OTHER): Payer: Medicaid Other | Admitting: Obstetrics and Gynecology

## 2021-03-16 ENCOUNTER — Other Ambulatory Visit: Payer: Self-pay

## 2021-03-16 VITALS — BP 109/69 | HR 81 | Wt 107.6 lb

## 2021-03-16 DIAGNOSIS — O26613 Liver and biliary tract disorders in pregnancy, third trimester: Secondary | ICD-10-CM

## 2021-03-16 DIAGNOSIS — K831 Obstruction of bile duct: Secondary | ICD-10-CM

## 2021-03-16 DIAGNOSIS — O09899 Supervision of other high risk pregnancies, unspecified trimester: Secondary | ICD-10-CM

## 2021-03-16 DIAGNOSIS — Z3A36 36 weeks gestation of pregnancy: Secondary | ICD-10-CM

## 2021-03-16 DIAGNOSIS — O0993 Supervision of high risk pregnancy, unspecified, third trimester: Secondary | ICD-10-CM

## 2021-03-16 NOTE — Progress Notes (Signed)
Pt reports fetal movement with some irritability, reports some discharge that she states is different from "her normal".

## 2021-03-16 NOTE — Progress Notes (Signed)
   PRENATAL VISIT NOTE  Subjective:  Karina [name] is a 25 y.o. G2P0101 at [redacted]w[redacted]d being seen today for ongoing prenatal care.  She is currently monitored for the following issues for this high-risk pregnancy and has Supervision of high-risk pregnancy; History of preterm delivery, currently pregnant; Cholestasis of pregnancy; and [redacted] weeks gestation of pregnancy on their problem list.  Patient doing well with no acute concerns today. She reports occasional contractions.  Contractions: Irritability. Vag. Bleeding: None.  Movement: Present. Denies leaking of fluid.   The following portions of the patient's history were reviewed and updated as appropriate: allergies, current medications, past family history, past medical history, past social history, past surgical history and problem list. Problem list updated.  Objective:   Vitals:   03/16/21 0926  BP: 109/69  Pulse: 81  Weight: 107 lb 9.6 oz (48.8 kg)    Fetal Status: Fetal Heart Rate (bpm): 145 Fundal Height: 37 cm Movement: Present     General:  Alert, oriented and cooperative. Patient is in no acute distress.  Skin: Skin is warm and dry. No rash noted.   Cardiovascular: Normal heart rate noted  Respiratory: Normal respiratory effort, no problems with respiration noted  Abdomen: Soft, gravid, appropriate for gestational age.  Pain/Pressure: Present     Pelvic: Cervical exam deferred        Extremities: Normal range of motion.  Edema: None  Mental Status:  Normal mood and affect. Normal behavior. Normal judgment and thought content.   Assessment and Plan:  Pregnancy: G2P0101 at [redacted]w[redacted]d  1. Cholestasis during pregnancy in third trimester IOL at 37 weeks  2. Supervision of high risk pregnancy in third trimester   3. History of preterm delivery, currently pregnant   4. [redacted] weeks gestation of pregnancy   Preterm labor symptoms and general obstetric precautions including but not limited to vaginal bleeding, contractions,  leaking of fluid and fetal movement were reviewed in detail with the patient.  Please refer to After Visit Summary for other counseling recommendations.   No follow-ups on file.   Mariel Aloe, MD Faculty Attending [facility]

## 2021-03-18 LAB — STREP GP B NAA: Strep Gp B NAA: NEGATIVE

## 2021-03-19 ENCOUNTER — Other Ambulatory Visit (HOSPITAL_COMMUNITY)
Admission: RE | Admit: 2021-03-19 | Discharge: 2021-03-19 | Disposition: A | Discharge status: Home / Self Care | Payer: Medicaid Other | Source: Ambulatory Visit | Attending: Obstetrics & Gynecology | Admitting: Obstetrics & Gynecology

## 2021-03-19 DIAGNOSIS — Z20822 Contact with and (suspected) exposure to covid-19: Secondary | ICD-10-CM | POA: Diagnosis not present

## 2021-03-19 DIAGNOSIS — Z01812 Encounter for preprocedural laboratory examination: Secondary | ICD-10-CM | POA: Insufficient documentation

## 2021-03-19 LAB — SARS CORONAVIRUS 2 (TAT 6-24 HRS): SARS Coronavirus 2: NEGATIVE

## 2021-03-21 ENCOUNTER — Inpatient Hospital Stay (HOSPITAL_COMMUNITY): Payer: Medicaid Other

## 2021-03-21 ENCOUNTER — Inpatient Hospital Stay (HOSPITAL_COMMUNITY): Payer: Medicaid Other | Admitting: Anesthesiology

## 2021-03-21 ENCOUNTER — Inpatient Hospital Stay (HOSPITAL_COMMUNITY)
Admission: AD | Admit: 2021-03-21 | Discharge: 2021-03-23 | DRG: 805 | Disposition: A | Discharge status: Home / Self Care | Payer: Medicaid Other | Attending: Obstetrics & Gynecology | Admitting: Obstetrics & Gynecology

## 2021-03-21 ENCOUNTER — Other Ambulatory Visit: Payer: Self-pay

## 2021-03-21 ENCOUNTER — Encounter (HOSPITAL_COMMUNITY): Payer: Self-pay | Admitting: Obstetrics & Gynecology

## 2021-03-21 DIAGNOSIS — O2662 Liver and biliary tract disorders in childbirth: Principal | ICD-10-CM | POA: Diagnosis present

## 2021-03-21 DIAGNOSIS — K831 Obstruction of bile duct: Secondary | ICD-10-CM

## 2021-03-21 DIAGNOSIS — O09899 Supervision of other high risk pregnancies, unspecified trimester: Secondary | ICD-10-CM

## 2021-03-21 DIAGNOSIS — Z3A37 37 weeks gestation of pregnancy: Secondary | ICD-10-CM

## 2021-03-21 DIAGNOSIS — Z8751 Personal history of pre-term labor: Secondary | ICD-10-CM

## 2021-03-21 DIAGNOSIS — O26613 Liver and biliary tract disorders in pregnancy, third trimester: Secondary | ICD-10-CM | POA: Diagnosis present

## 2021-03-21 DIAGNOSIS — O0993 Supervision of high risk pregnancy, unspecified, third trimester: Secondary | ICD-10-CM

## 2021-03-21 DIAGNOSIS — O26643 Intrahepatic cholestasis of pregnancy, third trimester: Secondary | ICD-10-CM | POA: Diagnosis present

## 2021-03-21 HISTORY — DX: Liver and biliary tract disorders in pregnancy, third trimester: O26.613

## 2021-03-21 HISTORY — DX: Obstruction of bile duct: K83.1

## 2021-03-21 LAB — RPR: RPR Ser Ql: NONREACTIVE

## 2021-03-21 LAB — CBC
HCT: 32.5 % — ABNORMAL LOW (ref 36.0–46.0)
Hemoglobin: 10.4 g/dL — ABNORMAL LOW (ref 12.0–15.0)
MCH: 25.9 pg — ABNORMAL LOW (ref 26.0–34.0)
MCHC: 32 g/dL (ref 30.0–36.0)
MCV: 81 fL (ref 80.0–100.0)
Platelets: 181 10*3/uL (ref 150–400)
RBC: 4.01 MIL/uL (ref 3.87–5.11)
RDW: 13.8 % (ref 11.5–15.5)
WBC: 7.2 10*3/uL (ref 4.0–10.5)
nRBC: 0 % (ref 0.0–0.2)

## 2021-03-21 LAB — TYPE AND SCREEN
ABO/RH(D): A POS
Antibody Screen: NEGATIVE

## 2021-03-21 MED ORDER — FENTANYL-BUPIVACAINE-NACL 0.5-0.125-0.9 MG/250ML-% EP SOLN
12.0000 mL/h | EPIDURAL | Status: DC | PRN
  Filled 2021-03-21: qty 250

## 2021-03-21 MED ORDER — ZOLPIDEM TARTRATE 5 MG PO TABS: 5.0000 mg | ORAL_TABLET | Freq: Every evening | ORAL | Status: DC | PRN

## 2021-03-21 MED ORDER — LIDOCAINE HCL (PF) 1 % IJ SOLN
INTRAMUSCULAR | Status: DC | PRN
  Administered 2021-03-21 (×2): 4 mL via EPIDURAL

## 2021-03-21 MED ORDER — LACTATED RINGERS IV SOLN: 500.0000 mL | Freq: Once | INTRAVENOUS | Status: DC

## 2021-03-21 MED ORDER — EPHEDRINE 5 MG/ML INJ: 10.0000 mg | INTRAVENOUS | Status: DC | PRN

## 2021-03-21 MED ORDER — OXYCODONE-ACETAMINOPHEN 5-325 MG PO TABS: 1.0000 | ORAL_TABLET | ORAL | Status: DC | PRN

## 2021-03-21 MED ORDER — COCONUT OIL OIL: 1.0000 "application " | TOPICAL_OIL | Status: DC | PRN

## 2021-03-21 MED ORDER — PHENYLEPHRINE 40 MCG/ML (10ML) SYRINGE FOR IV PUSH (FOR BLOOD PRESSURE SUPPORT)
80.0000 ug | PREFILLED_SYRINGE | INTRAVENOUS | Status: DC | PRN
  Filled 2021-03-21: qty 10

## 2021-03-21 MED ORDER — SENNOSIDES-DOCUSATE SODIUM 8.6-50 MG PO TABS
2.0000 | ORAL_TABLET | Freq: Every day | ORAL | Status: DC
  Administered 2021-03-22: 2 via ORAL
  Filled 2021-03-21 (×2): qty 2

## 2021-03-21 MED ORDER — LACTATED RINGERS IV SOLN: 500.0000 mL | INTRAVENOUS | Status: DC | PRN

## 2021-03-21 MED ORDER — SOD CITRATE-CITRIC ACID 500-334 MG/5ML PO SOLN: 30.0000 mL | ORAL | Status: DC | PRN

## 2021-03-21 MED ORDER — ACETAMINOPHEN 325 MG PO TABS: 650.0000 mg | ORAL_TABLET | ORAL | Status: DC | PRN

## 2021-03-21 MED ORDER — TETANUS-DIPHTH-ACELL PERTUSSIS 5-2.5-18.5 LF-MCG/0.5 IM SUSY: 0.5000 mL | PREFILLED_SYRINGE | Freq: Once | INTRAMUSCULAR | Status: DC

## 2021-03-21 MED ORDER — OXYCODONE-ACETAMINOPHEN 5-325 MG PO TABS: 2.0000 | ORAL_TABLET | ORAL | Status: DC | PRN

## 2021-03-21 MED ORDER — FENTANYL CITRATE (PF) 100 MCG/2ML IJ SOLN
50.0000 ug | INTRAMUSCULAR | Status: DC | PRN
  Administered 2021-03-21: 50 ug via INTRAVENOUS

## 2021-03-21 MED ORDER — DIBUCAINE (PERIANAL) 1 % EX OINT: 1.0000 "application " | TOPICAL_OINTMENT | CUTANEOUS | Status: DC | PRN

## 2021-03-21 MED ORDER — ONDANSETRON HCL 4 MG/2ML IJ SOLN: 4.0000 mg | INTRAMUSCULAR | Status: DC | PRN

## 2021-03-21 MED ORDER — LACTATED RINGERS IV SOLN: INTRAVENOUS | Status: DC

## 2021-03-21 MED ORDER — IBUPROFEN 600 MG PO TABS
600.0000 mg | ORAL_TABLET | Freq: Four times a day (QID) | ORAL | Status: DC
  Administered 2021-03-22 – 2021-03-23 (×7): 600 mg via ORAL
  Filled 2021-03-21 (×7): qty 1

## 2021-03-21 MED ORDER — OXYTOCIN BOLUS FROM INFUSION
333.0000 mL | Freq: Once | INTRAVENOUS | Status: AC
  Administered 2021-03-21: 333 mL via INTRAVENOUS

## 2021-03-21 MED ORDER — PRENATAL MULTIVITAMIN CH
1.0000 | ORAL_TABLET | Freq: Every day | ORAL | Status: DC
  Administered 2021-03-22 – 2021-03-23 (×2): 1 via ORAL
  Filled 2021-03-21 (×2): qty 1

## 2021-03-21 MED ORDER — ONDANSETRON HCL 4 MG PO TABS: 4.0000 mg | ORAL_TABLET | ORAL | Status: DC | PRN

## 2021-03-21 MED ORDER — FENTANYL CITRATE (PF) 100 MCG/2ML IJ SOLN
INTRAMUSCULAR | Status: AC
  Filled 2021-03-21: qty 2

## 2021-03-21 MED ORDER — BENZOCAINE-MENTHOL 20-0.5 % EX AERO: 1.0000 "application " | INHALATION_SPRAY | CUTANEOUS | Status: DC | PRN

## 2021-03-21 MED ORDER — DIPHENHYDRAMINE HCL 25 MG PO CAPS: 25.0000 mg | ORAL_CAPSULE | Freq: Four times a day (QID) | ORAL | Status: DC | PRN

## 2021-03-21 MED ORDER — WITCH HAZEL-GLYCERIN EX PADS: 1.0000 "application " | MEDICATED_PAD | CUTANEOUS | Status: DC | PRN

## 2021-03-21 MED ORDER — ACETAMINOPHEN 325 MG PO TABS
650.0000 mg | ORAL_TABLET | ORAL | Status: DC | PRN
  Administered 2021-03-22: 650 mg via ORAL
  Filled 2021-03-21: qty 2

## 2021-03-21 MED ORDER — DIPHENHYDRAMINE HCL 50 MG/ML IJ SOLN: 12.5000 mg | INTRAMUSCULAR | Status: DC | PRN

## 2021-03-21 MED ORDER — MISOPROSTOL 25 MCG QUARTER TABLET
25.0000 ug | ORAL_TABLET | ORAL | Status: DC | PRN
  Filled 2021-03-21: qty 1

## 2021-03-21 MED ORDER — TERBUTALINE SULFATE 1 MG/ML IJ SOLN: 0.2500 mg | Freq: Once | INTRAMUSCULAR | Status: DC | PRN

## 2021-03-21 MED ORDER — ONDANSETRON HCL 4 MG/2ML IJ SOLN: 4.0000 mg | Freq: Four times a day (QID) | INTRAMUSCULAR | Status: DC | PRN

## 2021-03-21 MED ORDER — OXYTOCIN-SODIUM CHLORIDE 30-0.9 UT/500ML-% IV SOLN: 2.5000 [IU]/h | INTRAVENOUS | Status: DC

## 2021-03-21 MED ORDER — LIDOCAINE HCL (PF) 1 % IJ SOLN: 30.0000 mL | INTRAMUSCULAR | Status: DC | PRN

## 2021-03-21 MED ORDER — PHENYLEPHRINE 40 MCG/ML (10ML) SYRINGE FOR IV PUSH (FOR BLOOD PRESSURE SUPPORT): 80.0000 ug | PREFILLED_SYRINGE | INTRAVENOUS | Status: DC | PRN

## 2021-03-21 MED ORDER — SIMETHICONE 80 MG PO CHEW: 80.0000 mg | CHEWABLE_TABLET | ORAL | Status: DC | PRN

## 2021-03-21 MED ORDER — FENTANYL-BUPIVACAINE-NACL 0.5-0.125-0.9 MG/250ML-% EP SOLN
EPIDURAL | Status: DC | PRN
  Administered 2021-03-21: 12 mL/h via EPIDURAL

## 2021-03-21 MED ORDER — OXYTOCIN-SODIUM CHLORIDE 30-0.9 UT/500ML-% IV SOLN
1.0000 m[IU]/min | INTRAVENOUS | Status: DC
  Administered 2021-03-21: 2 m[IU]/min via INTRAVENOUS
  Filled 2021-03-21: qty 500

## 2021-03-21 NOTE — Anesthesia Preprocedure Evaluation (Signed)
Anesthesia Evaluation  Patient identified by MRN, date of birth, ID band Patient awake    Reviewed: Allergy & Precautions, Patient's Chart, lab work & pertinent test results  History of Anesthesia Complications Negative for: history of anesthetic complications  Airway Mallampati: II  TM Distance: >3 FB Neck ROM: Full    Dental no notable dental hx.    Pulmonary neg pulmonary ROS,    Pulmonary exam normal        Cardiovascular negative cardio ROS Normal cardiovascular exam     Neuro/Psych negative neurological ROS  negative psych ROS   GI/Hepatic Neg liver ROS, cholestasis   Endo/Other  negative endocrine ROS  Renal/GU negative Renal ROS  negative genitourinary   Musculoskeletal negative musculoskeletal ROS (+)   Abdominal   Peds  Hematology negative hematology ROS (+)   Anesthesia Other Findings Day of surgery medications reviewed with patient.  Reproductive/Obstetrics (+) Pregnancy                             Anesthesia Physical Anesthesia Plan  ASA: II  Anesthesia Plan: Epidural   Post-op Pain Management:    Induction:   PONV Risk Score and Plan: Treatment may vary due to age or medical condition  Airway Management Planned: Natural Airway  Additional Equipment:   Intra-op Plan:   Post-operative Plan:   Informed Consent: I have reviewed the patients History and Physical, chart, labs and discussed the procedure including the risks, benefits and alternatives for the proposed anesthesia with the patient or authorized representative who has indicated his/her understanding and acceptance.       Plan Discussed with:   Anesthesia Plan Comments:         Anesthesia Quick Evaluation

## 2021-03-21 NOTE — Anesthesia Procedure Notes (Signed)
Epidural Patient location during procedure: OB Start time: 03/21/2021 5:59 PM End time: 03/21/2021 6:02 PM  Staffing Anesthesiologist: Kaylyn Layer, MD Performed: anesthesiologist   Preanesthetic Checklist Completed: patient identified, IV checked, risks and benefits discussed, monitors and equipment checked, pre-op evaluation and timeout performed  Epidural Patient position: sitting Prep: DuraPrep and site prepped and draped Patient monitoring: continuous pulse ox, blood pressure and heart rate Approach: midline Location: L3-L4 Injection technique: LOR air  Needle:  Needle type: Tuohy  Needle gauge: 17 G Needle length: 9 cm Catheter type: closed end flexible Catheter size: 19 Gauge Catheter at skin depth: 8 cm Test dose: negative and Other (1% lidocaine)  Assessment Events: blood not aspirated, injection not painful, no injection resistance, no paresthesia and negative IV test  Additional Notes Patient identified. Risks, benefits, and alternatives discussed with patient including but not limited to bleeding, infection, nerve damage, paralysis, failed block, incomplete pain control, headache, blood pressure changes, nausea, vomiting, reactions to medication, itching, and postpartum back pain. Confirmed with bedside nurse the patient's most recent platelet count. Confirmed with patient that they are not currently taking any anticoagulation, have any bleeding history, or any family history of bleeding disorders. Patient expressed understanding and wished to proceed. All questions were answered. Sterile technique was used throughout the entire procedure. Please see nursing notes for vital signs.   Crisp LOR on first pass. Test dose was given through epidural catheter and negative prior to continuing to dose epidural or start infusion. Warning signs of high block given to the patient including shortness of breath, tingling/numbness in hands, complete motor block, or any concerning  symptoms with instructions to call for help. Patient was given instructions on fall risk and not to get out of bed. All questions and concerns addressed with instructions to call with any issues or inadequate analgesia.  Reason for block:procedure for pain

## 2021-03-21 NOTE — Lactation Note (Signed)
This note was copied from a Karina's chart. Lactation Consultation Note  Patient Name: Karina [name] Today's Date: 03/21/2021 Reason for consult: L&D Initial assessment;Early term 37-38.6wks Age:25 hours  L&D consult with 65 minutes old infant and P2 mother. Parents and grandmother are present at time of consult. Congratulated them on their newborn. Infant is skin to skin prone on mother's chest. Discussed STS as ideal transition for infants after birth helping with temperature, blood sugar and comfort. Talked about primal reflexes such as rooting, hands to mouth, searching for the breast among others.   LC assisted with latch, laid back position to left breast. Infant is still breastfeeding upon LC leaving room. Explained LC services availability during postpartum stay. Thanked family for their time.    Maternal Data Has patient been taught Hand Expression?: Yes Does the patient have breastfeeding experience prior to this delivery?: Yes How long did the patient breastfeed?: 4 weeks first child  Feeding Mother's Current Feeding Choice: Breast Milk  LATCH Score Latch: Grasps breast easily, tongue down, lips flanged, rhythmical sucking.  Audible Swallowing: Spontaneous and intermittent  Type of Nipple: Everted at rest and after stimulation  Comfort (Breast/Nipple): Soft / non-tender  Hold (Positioning): Assistance needed to correctly position infant at breast and maintain latch.  LATCH Score: 9  Interventions Interventions: Assisted with latch;Skin to skin;Breast massage;Hand express;Expressed milk  Discharge Pump: Personal WIC Program: Yes  Consult Status Consult Status: Follow-up Date: 03/21/21 Follow-up type: In-patient    [name] 03/21/2021, 7:28 PM

## 2021-03-21 NOTE — Plan of Care (Signed)
Gilford Lardizabal, RN 

## 2021-03-21 NOTE — Discharge Summary (Signed)
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 yo [id number] admitted to the hospital 03/21/2021 for induction of labor.  Indication for induction: Cholestasis of pregnancy.  Patient had an uncomplicated labor course as follows: Membrane Rupture Time/Date: 4:23 PM ,03/21/2021   Delivery Method:Vaginal, Spontaneous  Episiotomy: None  Lacerations:  1st degree  Details of delivery can be found in separate delivery note.  Patient had a routine postpartum course. Patient is discharged home 03/23/21.  Newborn Data: Birth date:03/21/2021  Birth time:6:38 PM  Gender:Female  Living status:Living  Apgars:9 ,9  Weight:2960 g   Magnesium Sulfate received: No BMZ received: No Rhophylac:No MMR:No T-DaP:Given prenatally Flu: N/A Transfusion:No  Physical exam  Vitals:   03/22/21 1043 03/22/21 1602 03/22/21 2111 03/23/21 0518  BP: 105/64 113/71 114/74 (!) 129/94  Pulse: 72 64 63 62  Resp: _0 Temp: 98.2 F (36.8 C) 98.3 F (36.8 C) 98.6 F (37 C) 97.8 F (36.6 C)  TempSrc: Oral Oral Oral Oral  SpO2: 98% 99% 100% 100%  Weight:      Height:       General: alert, cooperative and no distress Lochia: appropriate Uterine Fundus: firm Incision:  N/A DVT Evaluation: No evidence of DVT seen on physical exam. Labs: Lab Results  Component Value Date   WBC 7.2 03/21/2021   HGB 10.4 (L) 03/21/2021   HCT 32.5 (L) 03/21/2021   MCV 81.0 03/21/2021   PLT 181 03/21/2021   CMP Latest Ref Rng & Units 03/09/2021  Glucose 65 - 99 mg/dL 88  BUN 6 - 20 mg/dL 8  Creatinine 0.57 - 1.00 mg/dL 0.46(L)  Sodium 134 - 144 mmol/L 139  Potassium 3.5 - 5.2 mmol/L 3.8  Chloride 96 - 106 mmol/L 103  CO2 20 - 29 mmol/L 20  Calcium 8.7 - 10.2 mg/dL 8.7  Total Protein 6.0 - 8.5 g/dL 6.1  Total Bilirubin 0.0 - 1.2 mg/dL 0.5  Alkaline Phos 44 - 121 IU/L 191(H)  AST 0 - 40 IU/L 65(H)  ALT 0 - 32 IU/L 147(H)   EdFlavia Shippercore: Edinburgh Postnatal Depression Scale Screening Tool 03/22/2021  I have been able to laugh and see the funny side of things. 0  I have looked forward with enjoyment to things. 0  I have blamed myself unnecessarily when things went wrong. 0  I have been anxious or worried for no good reason. 1  I have felt scared or panicky for no good reason. 1  Things have been getting on top of me. 0  I have been so unhappy that I have had difficulty sleeping. 0  I have felt sad or miserable. 0  I have been so unhappy that I have been crying. 0  The thought of harming myself has occurred to me. 0  Edinburgh Postnatal Depression Scale Total 2    After visit meds:  Allergies as of 03/23/2021   No Known Allergies     Medication List    STOP taking these medications   HYDROXYprogesterone caproate autoinjector Commonly known as: Makena   ursodiol 500 MG tablet Commonly known as: ACTIGALL     TAKE these medications   acetaminophen 325 MG tablet Commonly known as: Tylenol Take 2 tablets (650 mg total) by mouth every 4 (four) hours as needed.   benzocaine-Menthol 20-0.5 % Aero Commonly known as: DERMOPLAST Apply 1 application topically as needed for irritation (perineal discomfort).   Blood Pressure Kit Devi 1 kit by Does not apply  route once a week. Check Blood Pressure regularly and record readings into the Bab[employer] App.  Large Cuff.  DX O90.0   cyclobenzaprine 10 MG tablet Commonly known as: FLEXERIL Take 1 tablet (10 mg total) by mouth 2 (two) times daily as needed for muscle spasms.   hydrOXYzine 25 MG capsule Commonly known as: Vistaril Take 1 capsule (25 mg total) by mouth 3 (three) times daily as needed.   ibuprofen 600 MG tablet Commonly known as: ADVIL Take 1 tablet (600 mg total) by mouth every 6 (six) hours.   Prenatal 28-0.8 MG Tabs Take 1 tablet by mouth daily.   witch hazel-glycerin pad Commonly known as: TUCKS Apply 1 application topically as needed for hemorrhoids.      Discharge home in stable condition Infant Feeding: breast and bottle feeding Infant Disposition:home with mother Discharge instruction: per After Visit Summary and Postpartum booklet. Activity: Advance as tolerated. Pelvic rest for 6 weeks.  Diet: routine diet Future Appointments:  Future Appointments  Date Time Provider [place]  05/03/2021  2:10 PM Gavin Pound, CNM [place] None   Follow up Visit:  Please schedule this patient for a In person postpartum visit in 6 weeks with the following provider: Any provider. Additional Postpartum F/U:BP check 1 week  Patient had 1 mild range BP during PP course. She remained normotensive throughout pregnancy and labor. Patient is asymptomatic. Given isolated elevated BP, patient does not meet criteria for antihypertensive at this time. Will send message to schedule BP check in 1 week. Reviewed pre-e symptoms with patient.  Low risk pregnancy complicated by: cholestasis Delivery mode:  Vaginal, Spontaneous  Anticipated Birth Control:  Unsure; patient provided with information regarding various forms of birth control. Patient instructed to abstain from intercourse or if she does become sexually active prior to [facility] visit, to use condoms to prevent pregnancy   Message  sent to [facility] to schedule PP visit by Maryagnes Amos, CNM.    Renee Harder, CNM  03/23/21 9:39 AM

## 2021-03-21 NOTE — Progress Notes (Signed)
Labor Progress Note Draven Laine Propst is a 25 y.o. G2P0101 at [redacted]w[redacted]d presented for IOL for Cholestasis. S: Patient not feeling significant contractions.  O:  BP 104/65   Pulse 91   Temp 98.4 F (36.9 C) (Oral)   Resp 16   Ht 5\' 1"  (1.549 m)   Wt 49.2 kg   LMP 07/05/2020   BMI 20.48 kg/m  EFM: 145/Moderate/ + Accels, - Deccels  CVE: Dilation: 3 Effacement (%): 70 Station: -1 Presentation: Vertex Exam by:: [email or web address], RN   A&P: 25 y.o. G2P0101 [redacted]w[redacted]d presented for IOL for Cholestasis. #Labor: Progressing well. Continue to increase Pit as patient not feeling significant contractions. #Pain: PRN #FWB: Cat 1 #GBS negative #Cholestasis: Last Bile Acid was 42 at 34 weeks.  Started on Actigall and Vistaril.    [redacted]w[redacted]d, MD 1:41 PM

## 2021-03-21 NOTE — H&P (Addendum)
OBSTETRIC ADMISSION HISTORY AND PHYSICAL  Karina [name] is a 25 y.o. female G30P0101 with IUP at 56w0dby 10 week [place] for Cholestasis. She reports +FMs, No LOF, no VB, no blurry vision, headaches or peripheral edema, and RUQ pain.  She plans on breast and bottle feeding. She request Depo for birth control. [facility] [place]  Estimated Date of Delivery: 04/11/21  Sono:    _0 , CWD, normal anatomy, Cephalic presentation, Anterior lie, 2597g, 27% EFW   Prenatal History/Complications: Cholestasis, Hx of Preterm Labor and Cholestasis  Past Medical History: Past Medical History:  Diagnosis Date   Cholestasis during pregnancy    Preterm labor     Past Surgical History: Past Surgical History:  Procedure Laterality Date   NO PAST SURGERIES      Obstetrical History: OB History     Gravida  2   Para  1   Term      Preterm  1   AB      Living  1      SAB      IAB      Ectopic      Multiple  0   Live Births  1           Social History Social History   Socioeconomic History   Marital status: Single    Spouse name: JShea Stakes  Number of children: 1   Years of education: Not on file   Highest education level: Not on file  Occupational History   Occupation: [place] Tobacco Use   Smoking status: Never Smoker   Smokeless tobacco: Never Used  VScientific laboratory technicianUse: Never used  Substance and Sexual Activity   Alcohol use: No   Drug use: No    Types: Marijuana    Comment: prior to first preg   Sexual activity: Not Currently    Birth control/protection: None  Other Topics Concern   Not on file  Social History Narrative   Not on file   Social Determinants of Health   Financial Resource Strain: Not on file  Food Insecurity: Not on file  Transportation Needs: Not on file  Physical Activity: Not on file  Stress: Not on file  Social Connections: Not on file     Family History: Family History  Problem Relation Age of Onset   Healthy Mother    Hyperlipidemia Father    Diabetes Father     Allergies: No Known Allergies  Facility-Administered Medications Prior to Admission  Medication Dose Route Frequency Provider Last Rate Last Admin   HYDROXYprogesterone caproate (Makena) autoinjector 275 mg  275 mg Subcutaneous Weekly Constant, Peggy, MD   275 mg at 02/22/21 1321   Medications Prior to Admission  Medication Sig Dispense Refill Last Dose   acetaminophen (TYLENOL) 325 MG tablet Take 2 tablets (650 mg total) by mouth every 4 (four) hours as needed. (Patient not taking: No sig reported) 180 tablet 0    Blood Pressure Monitoring (BLOOD PRESSURE KIT) DEVI 1 kit by Does not apply route once a week. Check Blood Pressure regularly and record readings into the [employer] App.  Large Cuff.  DX O90.0 1 each 0    cyclobenzaprine (FLEXERIL) 10 MG tablet Take 1 tablet (10 mg total) by mouth 2 (two) times daily as needed for muscle spasms. (Patient not taking: No sig reported) 20 tablet  0    HYDROXYprogesterone caproate (MAKENA) autoinjector Inject 275 mg into the skin every 7 (seven) days. (Patient not taking: No sig reported)      hydrOXYzine (VISTARIL) 25 MG capsule Take 1 capsule (25 mg total) by mouth 3 (three) times daily as needed. (Patient not taking: No sig reported) 30 capsule 0    PRENATAL 28-0.8 MG TABS Take 1 tablet by mouth daily. 30 each 12    ursodiol (ACTIGALL) 500 MG tablet Take 1 tablet (500 mg total) by mouth in the morning and at bedtime. 46 tablet 0      Review of Systems   All systems reviewed and negative except as stated in HPI  Blood pressure 117/79, pulse 71, resp. rate 16, height _0  (1.549 m), weight 49.2 kg, last menstrual period 07/05/2020. General appearance: alert and cooperative Lungs: no issues with breathing Heart: regular rate and rhythm Abdomen: soft, non-tender; bowel sounds normal Presentation:  cephalic Fetal monitoringBaseline: 140 bpm, Variability: Good {> 6 bpm), Accelerations: Reactive and Decelerations: Absent Uterine activityIntensity: moderate Dilation: 3 Effacement (%): 70 Station: -1 Exam by:: Rosana Hoes, RN   Prenatal labs: ABO, Rh: A+ Antibody: Negative Rubella: 1.60 (12/15 1541) RPR: Non Reactive (02/25 0937)  HBsAg: Negative (12/15 1541)  HIV: Non Reactive (02/25 0937)  GBS: Negative/-- (05/06 1142)  2 hr Glucola Negative Genetic screening Normal Anatomy US Normal  Prenatal Transfer Tool  Maternal Diabetes: No Genetic Screening: Normal Maternal Ultrasounds/Referrals: Normal Fetal Ultrasounds or other Referrals:  None Maternal Substance Abuse:  No Significant Maternal Medications:  Meds include: Other: Actigall, Progesterone qweekly Significant Maternal Lab Results: Group B Strep negative and Other:   Results for orders placed or performed during the hospital encounter of 03/21/21 (from the past 24 hour(s))  Type and screen   Collection Time: 03/21/21  7:57 AM  Result Value Ref Range   ABO/RH(D) PENDING    Antibody Screen PENDING    Sample Expiration      03/24/2021,2359 Performed at [facility], [address], [place], [place] [address]   CBC   Collection Time: 03/21/21  7:58 AM  Result Value Ref Range   WBC 7.2 4.0 - 10.5 K/uL   RBC 4.01 3.87 - 5.11 MIL/uL   Hemoglobin 10.4 (L) 12.0 - 15.0 g/dL   HCT 32.5 (L) 36.0 - 46.0 %   MCV 81.0 80.0 - 100.0 fL   MCH 25.9 (L) 26.0 - 34.0 pg   MCHC 32.0 30.0 - 36.0 g/dL   RDW 13.8 11.5 - 15.5 %   Platelets 181 150 - 400 K/uL   nRBC 0.0 0.0 - 0.2 %    Patient Active Problem List   Diagnosis Date Noted   Cholestasis during pregnancy in third trimester 03/21/2021   [redacted] weeks gestation of pregnancy 03/16/2021   Cholestasis of pregnancy 02/26/2021   History of preterm delivery 11/09/2020   Supervision of high-risk pregnancy 10/24/2020    Assessment/Plan:  Karina [name] is a 25 y.o.  G2P0101 at 73w0dhere for IOL for Cholestasis.  #Induction:  Plan to start Pitocin after  #Pain: PRN, would like to try without epidural #FWB: Cat 1 #ID: GBS negative. #MOF: Breast and Bottle #MOC: Depo #Circ: N/A #Cholestasis: Last Bile Acid was 42 at 34 weeks.  Started on Actigall and Vistaril.     PDelora Fuel MD  03/21/2021, 8:57 AM  Midwife attestation: I have seen and examined this patient; I agree with above documentation in the resident's note.   Karina Apo  A [name] is a 25 y.o. G2P0101 at 34w0dhere for IOL for cholestasis of pregnancy.  PE: BP 118/68   Pulse 69   Temp 98.4 F (36.9 C) (Oral)   Resp 16   Ht _0  (1.549 m)   Wt 108 lb 6.4 oz (49.2 kg)   LMP 07/05/2020   BMI 20.48 kg/m  Gen: calm comfortable, NAD Resp: normal effort, no distress Abd: gravid  ROS, labs, PMH reviewed  Plan: Admit to LD Labor: Start Pitocin for Bishop score >6 Fetal monitoring: Category I ID: GBS negative  LFatima [name] CNM  03/21/2021, 1:57 PM

## 2021-03-21 NOTE — Progress Notes (Signed)
Karina [name] is a 25 y.o. G2P0101 at [redacted]w[redacted]d by LMP admitted for induction of labor due to cholestasis of pregnancy.  Subjective: Pt comfortable without contractions, feeling good fetal movement, husband in room for support.  Objective: BP 117/79   Pulse 71   Resp 16   Ht 5\' 1"  (1.549 m)   Wt 49.2 kg   LMP 07/05/2020   BMI 20.48 kg/m  No intake/output data recorded. No intake/output data recorded.  FHT:  FHR: 135 bpm, variability: moderate,  accelerations:  Present,  decelerations:  Absent UC:   irregular, every 2-8 minutes, mild to palpation SVE:   Dilation: 3 Effacement (%): 70 Station: -1 Exam by:: [email or web address], RN  Labs: Lab Results  Component Value Date   WBC 7.2 03/21/2021   HGB 10.4 (L) 03/21/2021   HCT 32.5 (L) 03/21/2021   MCV 81.0 03/21/2021   PLT 181 03/21/2021    Assessment / Plan: Induction of labor due to cholestasis of pregnancy Category I FHR tracing   Labor: Plan to start Pitocin for Bishop score >6. Pt would like light meal first, so will start after eating.   Preeclampsia:  n/a Fetal Wellbeing:  Category I Pain Control:  Labor support without medications I/D:  GBS neg Anticipated MOD:  NSVD  05/21/2021 03/21/2021, 9:35 AM

## 2021-03-21 NOTE — Progress Notes (Signed)
Labor Progress Note Karina [name] is a 24 y.o. G2P0101 at [redacted]w[redacted]d presented for IOL for Cholestasis. S: Patient still not feeling significant contractions.  O:  BP 121/72   Pulse 90   Temp 98.4 F (36.9 C) (Oral)   Resp 16   Ht 5\' 1"  (1.549 m)   Wt 49.2 kg   LMP 07/05/2020   BMI 20.48 kg/m  EFM: 145/Moderate/ + Accels, - Deccels  CVE: Dilation: 3 Effacement (%): 70 Station: -1 Presentation: Vertex Exam by:: Leftwich-Kirby, CNM   A&P: 25 y.o. 25 [redacted]w[redacted]d presented for IOL for Cholestasis. #Labor: Unchanged exam.  Continue to increase Pit as patient not feeling significant contractions.  AROM performed. #Pain: PRN #FWB: Cat 1 #GBS negative  [redacted]w[redacted]d, MD 4:33 PM

## 2021-03-22 NOTE — Progress Notes (Addendum)
Post Partum Day 1 Subjective: Patient is doing well without complaints. Ambulating without difficulty. Voiding and passing flatus. Tolerating PO. Abdominal pain improved. Vaginal bleeding decreased.    Objective: Blood pressure 118/84, pulse 61, temperature 98.6 F (37 C), temperature source Oral, resp. rate 18, height 5\' 1"  (1.549 m), weight 49.2 kg, last menstrual period 07/05/2020, SpO2 100 %, unknown if currently breastfeeding.  Physical Exam:  General: alert, cooperative and no distress Lochia: appropriate Uterine Fundus: firm Incision: N/A DVT Evaluation: No evidence of DVT seen on physical exam.  Recent Labs    03/21/21 0758  HGB 10.4*  HCT 32.5*    Assessment/Plan: Plan for discharge tomorrow. The patient is doing well today. She plans to receive depo shot before discharge for contraception.    LOS: 1 day   05/21/21 03/22/2021, 7:25 AM     CNM attestation:  I have seen and examined this patient and agree with above documentation in the PA student's note.   Patient doing well. No concerns or issues throughout the night. Breastfeeding is going well. Wants depo for contraception prior to discharge. Plan for discharge on PPD #2.    05/22/2021, CNM 03/22/2021 8:54 AM

## 2021-03-22 NOTE — Anesthesia Postprocedure Evaluation (Signed)
Anesthesia Post Note  Patient: Elea A Henes  Procedure(s) Performed: AN AD HOC LABOR EPIDURAL     Patient location during evaluation: Mother Baby Anesthesia Type: Epidural Level of consciousness: awake and alert and oriented Pain management: satisfactory to patient Vital Signs Assessment: post-procedure vital signs reviewed and stable Respiratory status: respiratory function stable Cardiovascular status: stable Postop Assessment: no headache, no backache, epidural receding, patient able to bend at knees, no signs of nausea or vomiting, adequate PO intake and able to ambulate Anesthetic complications: no   No complications documented.  Last Vitals:  Vitals:   03/22/21 0200 03/22/21 0620  BP: 115/72 118/84  Pulse: 75 61  Resp: 18 18  Temp: 36.8 C 37 C  SpO2:      Last Pain:  Vitals:   03/22/21 0620  TempSrc: Oral  PainSc: 5    Pain Goal:                   Datha Kissinger

## 2021-03-22 NOTE — Lactation Note (Signed)
This note was copied from a Karina's chart. Lactation Consultation Note  Patient Name: Karina [name] Today's Date: 03/22/2021 Reason for consult: Follow-up assessment;Early term 37-38.6wks Age:25 hours   LC Follow Up Visit:  Mother is not interested in having lactation visits.  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  Informed her that she is eligible to receive a DEBP and provided the pump choices.  Mother will research the options and call back when she has made a pump decision.  RN updated.   Maternal Data    Feeding Mother's Current Feeding Choice: Breast Milk  LATCH Score                    Lactation Tools Discussed/Used    Interventions    Discharge    Consult Status Consult Status: Complete Date: 03/22/21 Follow-up type: In-patient    Karina [name] 03/22/2021, 4:31 PM

## 2021-03-23 MED ORDER — BENZOCAINE-MENTHOL 20-0.5 % EX AERO: 1.0000 "application " | INHALATION_SPRAY | CUTANEOUS | 0 refills | Status: DC | PRN

## 2021-03-23 MED ORDER — IBUPROFEN 600 MG PO TABS: 600.0000 mg | ORAL_TABLET | Freq: Four times a day (QID) | ORAL | 0 refills | Status: DC

## 2021-03-23 MED ORDER — WITCH HAZEL-GLYCERIN EX PADS: 1.0000 "application " | MEDICATED_PAD | CUTANEOUS | 12 refills | Status: DC | PRN

## 2021-03-23 NOTE — Lactation Note (Signed)
This note was copied from a Karina's chart. Lactation Consultation Note  Patient Name: Karina [name] Today's Date: 03/23/2021 Reason for consult: Follow-up assessment;Early term 37-38.6wks;Infant weight loss Age:25 hours  Visited with mom of 42 hours old ETI female, she's a P2. Previous LC noted that mom was a [employer] and provided option on what type of pump she'd be eligible for. However, when this LC came in the room, mom voiced that she doesn't have [facility] insurance.  Apologized to mom and explained that she can try getting a new DEBP with her current insurance. Mom said she already has a pump at home and that's the one she'll be using for this Karina. Mom and Karina are going home today, Karina is at 2% weight loss. Reviewed discharge education and general breastfeeding 101. Mom reported all questions and concerns were answered, she's aware of LC OP services and will contact if needed.  Maternal Data    Feeding Mother's Current Feeding Choice: Breast Milk and Formula Nipple Type: Extra Slow Flow  LATCH Score                    Lactation Tools Discussed/Used    Interventions Interventions: Breast feeding basics reviewed;Education  Discharge Discharge Education: Engorgement and breast care;Warning signs for feeding Karina Pump: Personal  Consult Status Consult Status: Complete Date: 03/23/21 Follow-up type: Call as needed    Fahed Morten Venetia Constable 03/23/2021, 1:33 PM

## 2021-03-23 NOTE — Discharge Instructions (Signed)
Contraception Choices Contraception, also called birth control, refers to methods or devices that prevent pregnancy. Hormonal methods Contraceptive implant A contraceptive implant is a thin, plastic tube that contains a hormone that prevents pregnancy. It is different from an intrauterine device (IUD). It is inserted into the upper part of the arm by a health care provider. Implants can be effective for up to 3 years. Progestin-only injections Progestin-only injections are injections of progestin, a synthetic form of the hormone progesterone. They are given every 3 months by a health care provider. Birth control pills Birth control pills are pills that contain hormones that prevent pregnancy. They must be taken once a day, preferably at the same time each day. A prescription is needed to use this method of contraception. Birth control patch The birth control patch contains hormones that prevent pregnancy. It is placed on the skin and must be changed once a week for three weeks and removed on the fourth week. A prescription is needed to use this method of contraception. Vaginal ring A vaginal ring contains hormones that prevent pregnancy. It is placed in the vagina for three weeks and removed on the fourth week. After that, the process is repeated with a new ring. A prescription is needed to use this method of contraception. Emergency contraceptive Emergency contraceptives prevent pregnancy after unprotected sex. They come in pill form and can be taken up to 5 days after sex. They work best the sooner they are taken after having sex. Most emergency contraceptives are available without a prescription. This method should not be used as your only form of birth control.   Barrier methods Female condom A female condom is a thin sheath that is worn over the penis during sex. Condoms keep sperm from going inside a woman's body. They can be used with a sperm-killing substance (spermicide) to increase their  effectiveness. They should be thrown away after one use. Female condom A female condom is a soft, loose-fitting sheath that is put into the vagina before sex. The condom keeps sperm from going inside a woman's body. They should be thrown away after one use. Diaphragm A diaphragm is a soft, dome-shaped barrier. It is inserted into the vagina before sex, along with a spermicide. The diaphragm blocks sperm from entering the uterus, and the spermicide kills sperm. A diaphragm should be left in the vagina for 6-8 hours after sex and removed within 24 hours. A diaphragm is prescribed and fitted by a health care provider. A diaphragm should be replaced every 1-2 years, after giving birth, after gaining more than 15 lb (6.8 kg), and after pelvic surgery. Cervical cap A cervical cap is a round, soft latex or plastic cup that fits over the cervix. It is inserted into the vagina before sex, along with spermicide. It blocks sperm from entering the uterus. The cap should be left in place for 6-8 hours after sex and removed within 48 hours. A cervical cap must be prescribed and fitted by a health care provider. It should be replaced every 2 years. Sponge A sponge is a soft, circular piece of polyurethane foam with spermicide in it. The sponge helps block sperm from entering the uterus, and the spermicide kills sperm. To use it, you make it wet and then insert it into the vagina. It should be inserted before sex, left in for at least 6 hours after sex, and removed and thrown away within 30 hours. Spermicides Spermicides are chemicals that kill or block sperm from entering the cervix   and uterus. They can come as a cream, jelly, suppository, foam, or tablet. A spermicide should be inserted into the vagina with an applicator at least 10-15 minutes before sex to allow time for it to work. The process must be repeated every time you have sex. Spermicides do not require a prescription.   Intrauterine  contraception Intrauterine device (IUD) An IUD is a T-shaped device that is put in a woman's uterus. There are two types:  Hormone IUD.This type contains progestin, a synthetic form of the hormone progesterone. This type can stay in place for 3-5 years.  Copper IUD.This type is wrapped in copper wire. It can stay in place for 10 years. Permanent methods of contraception Female tubal ligation In this method, a woman's fallopian tubes are sealed, tied, or blocked during surgery to prevent eggs from traveling to the uterus. Hysteroscopic sterilization In this method, a small, flexible insert is placed into each fallopian tube. The inserts cause scar tissue to form in the fallopian tubes and block them, so sperm cannot reach an egg. The procedure takes about 3 months to be effective. Another form of birth control must be used during those 3 months. Female sterilization This is a procedure to tie off the tubes that carry sperm (vasectomy). After the procedure, the man can still ejaculate fluid (semen). Another form of birth control must be used for 3 months after the procedure. Natural planning methods Natural family planning In this method, a couple does not have sex on days when the woman could become pregnant. Calendar method In this method, the woman keeps track of the length of each menstrual cycle, identifies the days when pregnancy can happen, and does not have sex on those days. Ovulation method In this method, a couple avoids sex during ovulation. Symptothermal method This method involves not having sex during ovulation. The woman typically checks for ovulation by watching changes in her temperature and in the consistency of cervical mucus. Post-ovulation method In this method, a couple waits to have sex until after ovulation. Where to find more information  Centers for Disease Control and Prevention: [employer] Summary  Contraception, also called birth control, refers to methods or  devices that prevent pregnancy.  Hormonal methods of contraception include implants, injections, pills, patches, vaginal rings, and emergency contraceptives.  Barrier methods of contraception can include female condoms, female condoms, diaphragms, cervical caps, sponges, and spermicides.  There are two types of IUDs (intrauterine devices). An IUD can be put in a woman's uterus to prevent pregnancy for 3-5 years.  Permanent sterilization can be done through a procedure for males and females. Natural family planning methods involve nothaving sex on days when the woman could become pregnant. This information is not intended to replace advice given to you by your health care provider. Make sure you discuss any questions you have with your health care provider. Document Revised: 04/03/2020 Document Reviewed: 04/03/2020 Elsevier Patient Education  2021 Elsevier Inc. Postpartum Care After Vaginal Delivery The following information offers guidance about how to care for yourself from the time you deliver your baby to 6-12 weeks after delivery (postpartum period). If you have problems or questions, contact your health care provider for more specific instructions. Follow these instructions at home: Vaginal bleeding  It is normal to have vaginal bleeding (lochia) after delivery. Wear a sanitary pad for bleeding and discharge. ? During the first week after delivery, the amount and appearance of lochia is often similar to a menstrual period. ? Over the next few  weeks, it will gradually decrease to a dry, yellow-brown discharge. ? For most women, lochia stops completely by 4-6 weeks after delivery, but can vary.  Change your sanitary pads frequently. Watch for any changes in your flow, such as: ? A sudden increase in volume. ? A change in color. ? Large blood clots.  If you pass a blood clot from your vagina, save it and call your health care provider. Do not flush blood clots down the toilet before  talking with your health care provider.  Do not use tampons or douches until your health care provider approves.  If you are not breastfeeding, your period should return 6-8 weeks after delivery. If you are feeding your baby breast milk only, your period may not return until you stop breastfeeding. Perineal care  Keep the area between the vagina and the anus (perineum) clean and dry. Use medicated pads and pain-relieving sprays and creams as directed.  If you had a surgical cut in the perineum (episiotomy) or a tear, check the area for signs of infection until you are healed. Check for: ? More redness, swelling, or pain. ? Fluid or blood coming from the cut or tear. ? Warmth. ? Pus or a bad smell.  You may be given a squirt bottle to use instead of wiping to clean the perineum area after you use the bathroom. Pat the area gently to dry it.  To relieve pain caused by an episiotomy, a tear, or swollen veins in the anus (hemorrhoids), take a warm sitz bath 2-3 times a day. In a sitz bath, the warm water should only come up to your hips and cover your buttocks.   Breast care  In the first few days after delivery, your breasts may feel heavy, full, and uncomfortable (breast engorgement). Milk may also leak from your breasts. Ask your health care provider about ways to help relieve the discomfort.  If you are breastfeeding: ? Wear a bra that supports your breasts and fits well. Use breast pads to absorb milk that leaks. ? Keep your nipples clean and dry. Apply creams and ointments as told. ? You may have uterine contractions every time you breastfeed for up to several weeks after delivery. This helps your uterus return to its normal size. ? If you have any problems with breastfeeding, notify your health care provider or lactation consultant.  If you are not breastfeeding: ? Avoid touching your breasts. Do not squeeze out (express) milk. Doing this can make your breasts produce more  milk. ? Wear a good-fitting bra and use cold packs to help with swelling. Intimacy and sexuality  Ask your health care provider when you can engage in sexual activity. This may depend upon: ? Your risk of infection. ? How fast you are healing. ? Your comfort and desire to engage in sexual activity.  You are able to get pregnant after delivery, even if you have not had your period. Talk with your health care provider about methods of birth control (contraception) or family planning if you desire future pregnancies. Medicines  Take over-the-counter and prescription medicines only as told by your health care provider.  Take an over-the-counter stool softener to help ease bowel movements as told by your health care provider.  If you were prescribed an antibiotic medicine, take it as told by your health care provider. Do not stop taking the antibiotic even if you start to feel better.  Review all previous and current prescriptions to check for possible transfer into breast  milk. Activity  Gradually return to your normal activities as told by your health care provider.  Rest as much as possible. Nap while your baby is sleeping. Eating and drinking  Drink enough fluid to keep your urine pale yellow.  To help prevent or relieve constipation, eat high-fiber foods every day.  Choose healthy eating to support breastfeeding or weight loss goals.  Take your prenatal vitamins until your health care provider tells you to stop.   General tips/recommendations  Do not use any products that contain nicotine or tobacco. These products include cigarettes, chewing tobacco, and vaping devices, such as e-cigarettes. If you need help quitting, ask your health care provider.  Do not drink alcohol, especially if you are breastfeeding.  Do not take medications or drugs that are not prescribed to you, especially if you are breastfeeding.  Visit your health care provider for a postpartum checkup within  the first 3-6 weeks after delivery.  Complete a comprehensive postpartum visit no later than 12 weeks after delivery.  Keep all follow-up visits for you and your baby. Contact a health care provider if:  You feel unusually sad or worried.  Your breasts become red, painful, or hard.  You have a fever or other signs of an infection.  You have bleeding that is soaking through one pad an hour or you have blood clots.  You have a severe headache that doesn't go away or you have vision changes.  You have nausea and vomiting and are unable to eat or drink anything for 24 hours. Get help right away if:  You have chest pain or difficulty breathing.  You have sudden, severe leg pain.  You faint or have a seizure.  You have thoughts about hurting yourself or your baby. If you ever feel like you may hurt yourself or others, or have thoughts about taking your own life, get help right away. Go to your nearest emergency department or:  Call your local emergency services (911 in the [place]  The National Suicide Prevention Lifeline at 613-302-6523. This suicide crisis helpline is open 24 hours a day.  Text the Crisis Text Line at 858-315-5265 (in the [place] Summary  The period of time after you deliver your newborn up to 6-12 weeks after delivery is called the postpartum period.  Keep all follow-up visits for you and your baby.  Review all previous and current prescriptions to check for possible transfer into breast milk.  Contact a health care provider if you feel unusually sad or worried during the postpartum period. This information is not intended to replace advice given to you by your health care provider. Make sure you discuss any questions you have with your health care provider. Document Revised: 07/13/2020 Document Reviewed: 07/13/2020 Elsevier Patient Education  2021 [employer].

## 2021-03-30 ENCOUNTER — Other Ambulatory Visit: Payer: Self-pay

## 2021-03-30 ENCOUNTER — Ambulatory Visit (INDEPENDENT_AMBULATORY_CARE_PROVIDER_SITE_OTHER): Payer: Medicaid Other

## 2021-03-30 VITALS — BP 104/71 | HR 92

## 2021-03-30 DIAGNOSIS — Z0130 Encounter for examination of blood pressure without abnormal findings: Secondary | ICD-10-CM

## 2021-03-30 NOTE — Progress Notes (Signed)
..  Subjective:  Karina [name] is a 25 y.o. female here for BP check.   Hypertension ROS: no TIA's, no chest pain on exertion, no dyspnea on exertion and no swelling of ankles.    Objective:  BP 104/71   Pulse 92   LMP 07/05/2020   Breastfeeding Yes   Appearance alert, well appearing, and in no distress. General exam BP noted to be well controlled today in office.    Assessment:   Blood Pressure well controlled.   Plan:  Current treatment plan is effective, no change in therapy.. Pt has pp appt scheduled for 05-03-21

## 2021-03-30 NOTE — Progress Notes (Signed)
Patient was assessed and managed by nursing staff during this encounter. I have reviewed the chart and agree with the documentation and plan. I have also made any necessary editorial changes.  Warden Fillers, MD 03/30/2021 11:37 AM

## 2021-05-03 ENCOUNTER — Ambulatory Visit (INDEPENDENT_AMBULATORY_CARE_PROVIDER_SITE_OTHER): Payer: Medicaid Other

## 2021-05-03 ENCOUNTER — Other Ambulatory Visit: Payer: Self-pay

## 2021-05-03 NOTE — Patient Instructions (Signed)
AREA FAMILY PRACTICE PHYSICIANS  [place] ([address]) [facility] [address] [place], [place] [address] [phone] Mon-Fri 8:30-12:30, 1:30-5:00 Accepting Medicaid [facility] [address], [place], [place] [address] [phone] Mon-Fri 8:00-5:30 [facility] [address] [place], [place] [address] [phone] Mon, Tue, Thur, Fri 8:30-5:00, Wed 10:00-7:00 (closed 1-2pm) Accepting Medicaid [facility] [address], [place], [place]  [address] Phone - [phone]   Fax - [phone]  [place] ([address]) [facility] [address] [place], [place] [address] [phone] Mon-Fri 8:00-5:00 [facility] ([facility])  [address] [place], [place] [address] [phone] Mon-Fri 8:30-5:30, Sat (Oct.-Mar.) 9:00-1:00 Accepting Medicaid  [place] ([address]) [facility] [address], [place], [place] [address] [phone] Mon-Fri 8:00-5:00  [place] ([address]) [facility] [address], [place], [place] [address] [phone] Mon-Fri 8:00-5:00 [facility] [address], [place], [place] [address] [phone] Mon-Fri 8:00-5:00 [facility] [address] [place], [place] [address] [phone] Mon-Fri 8:00-5:00 [facility] [address] [place] [address] [phone] Mon-Fri 7:30-5:30  [place] ([address] & [address]) [facility] [address] [place], [place] [address] [phone] Mon-Thur 8:00-6:00 Accepting Medicaid [facility] [address] [place], [place] [address] [phone] Mon-Thur 7:30-7:30, Fri 7:30-4:30 Accepting  Medicaid [facility] [address], [place], [place]  [address] [phone]   Fax - [phone]  [place] ([address] & [address]) [facility] [address] [place], [place] [address] [phone] Mon-Fri 7:00-5:00 [facility] [address], [place], [place] [address] [phone] Mon-Fri 8:00-5:00 Accepting Medicaid [facility] [address], , [place] [address] [phone] Mon-Fri 8:00-5:00 Accepting Medicaid  [place] ([address]) [facility] [place] [address] [place], [place] [address] [phone] Mon-Fri 8:00-5:00 [facility] [address], [place], [place] [address] [phone] Mon-Fri 8:00-5:00 Accepting Medicaid [facility] ([facility]) [address], [place], [place] [address] [phone] Mon-Fri 8:00-5:30, Sat&Sun by appointment (phones open at 8:30) Accepting Medicaid  [place] ([address]) [facility] [address] [place], [place] [address] [phone] Mon-Thur 8:00-7:00, Fri 8:00-5:00, Sat 8:00-12:00, Sun 9:00-12:00 Accepting Medicaid [facility] [address], [place], [place] [address] [phone] Mon-Thur 8:00-5:00 Accepting Medicaid [facility] [address] [place], [place] [address] [phone] Mon-Fri 8:00-5:30, Sat (Oct.-Mar.) 9:00-1:00 Accepting Medicaid  [place] ([address]) [facility] [address], [place], [place] [address] [phone] Mon-Fri 8:00-5:00 Accepting Medicaid   [place] ([address]) [facility] [place] [address], [place], [place] [address] [phone] Mon-Fri 8:00-5:00 [facility] [place] [address], [place], [place] [address] [phone] Mon-Fri 8:00-5:00 [facility] [place] [address] [place] [address], [place], [place] [address] [phone] Mon-Fri 8:00-5:00 After hours clinic ([address] [place], [place] [address]) [phone] Mon-Fri 5:00-8:00, Sat 12:00-6:00, Sun 10:00-4:00 Accepting Medicaid [facility] [place]   [address], [place], [place]  [address] [phone]   Fax - [phone]  [place] ([address]) [facility] [place] [address], [place], [place] [address] [phone] Mon-Fri 8:00-5:00 [facility] [place] ([facility] [place]) [address], [place], [place] [address] [phone] Mon-Thur 8:00-7:00, Fri 8:00-5:00, Sat 8:00-12:00    

## 2021-05-03 NOTE — Progress Notes (Signed)
Post Partum Visit Note  Karina [name] is a 25 y.o. G90P1102 female who presents for a postpartum visit. She is 6 weeks postpartum following a normal spontaneous vaginal delivery.  I have fully reviewed the prenatal and intrapartum course. The delivery was at 37 gestational weeks.  Anesthesia: epidural. Postpartum course has been uncomplicated. Baby is doing well. Baby is feeding by breast. Bleeding no bleeding. Bowel function is normal. Bladder function is normal. Patient is not sexually active. Contraception method is abstinence. Postpartum depression screening: negative.  Patient denies feelings of depression and reports she receives support for infant care from her husband and mom. She reports breastfeeding is "going pretty good."   The pregnancy intention screening data noted above was reviewed. Potential methods of contraception were discussed. The patient elected to proceed with No Method - No Contraceptive Precautions.    Edinburgh Postnatal Depression Scale - 05/03/21 1418       Edinburgh Postnatal Depression Scale:  In the Past 7 Days   I have been able to laugh and see the funny side of things. 0    I have looked forward with enjoyment to things. 0    I have blamed myself unnecessarily when things went wrong. 0    I have been anxious or worried for no good reason. 0    I have felt scared or panicky for no good reason. 0    Things have been getting on top of me. 0    I have been so unhappy that I have had difficulty sleeping. 0    I have felt sad or miserable. 0    I have been so unhappy that I have been crying. 0    The thought of harming myself has occurred to me. 0    Edinburgh Postnatal Depression Scale Total 0             Health Maintenance Due  Topic Date Due   COVID-19 Vaccine (1) Never done   HPV VACCINES (1 - 2-dose series) Never done    The following portions of the patient's history were reviewed and updated as appropriate: allergies, current  medications, past family history, past medical history, past social history, past surgical history, and problem list.  Review of Systems Pertinent items are noted in HPI.  Objective:  BP 106/69   Pulse 79   Wt 89 lb (40.4 kg)   LMP 07/05/2020   BMI 16.82 kg/m    General:  alert, cooperative, and mild distress   Breasts:  normal  Lungs: clear to auscultation bilaterally  Heart:  regular rate and rhythm  Abdomen: normal findings: soft, non-tender   Wound N/A  GU exam:  normal       Assessment:   6 week postpartum exam.  Normal [facility] Breastfeeding Pap Smear UTD  Plan:   Essential components of care per ACOG recommendations:  1.  Mood and well being: Patient with negative depression screening today. Reviewed local resources for support.  - Patient tobacco use? No.   - hx of drug use? No.    2. Infant care and feeding:  -Patient currently breastmilk feeding? Yes. Discussed returning to work and pumping. Patient has pump for work use.  -Social determinants of health (SDOH) reviewed in EPIC. No concerns  3. Sexuality, contraception and birth spacing - Patient does not know want a pregnancy in the next year.  Desired family size is 3 children.  - Reviewed forms of contraception in tiered fashion. Patient desired  no method today.   - Discussed birth spacing of 18 months  4. Sleep and fatigue -Encouraged family/partner/community support of 4 hrs of uninterrupted sleep to help with mood and fatigue  5. Physical Recovery  - Discussed patients delivery and complications. She describes her labor as good. - Patient had a Vaginal, no problems at delivery. Patient had a 1st degree laceration. Perineal healing reviewed. Patient expressed understanding - Patient has urinary incontinence? No. - Patient is safe to resume physical and sexual activity  6.  Health Maintenance - HM due items addressed No - UTD - Last pap smear  Diagnosis  Date Value Ref Range Status  10/25/2020    Final   - Negative for intraepithelial lesion or malignancy (NILM)   Pap smear not done at today's visit.  -Breast Cancer screening indicated? No.   7. Chronic Disease/Pregnancy Condition follow up: None - PCP follow up  Cherre Robins, CNM [facility] [employer], [facility]

## 2021-11-11 NOTE — L&D Delivery Note (Signed)
Delivery Note At 7:06 PM a viable female was delivered via Vaginal, Spontaneous (Presentation:   Occiput Anterior).  APGAR: 8, 6; weight pending. After 1 minute, the cord was clamped and cut. 20 units of pitocin diluted in 500cc LR was infused rapidly IV.  The placenta separated spontaneously and delivered via CCT and maternal pushing effort.  It was inspected and appears to be intact with a 3 VC.    Anesthesia: Epidural Episiotomy: None Lacerations: 1st degree Suture Repair: 3.0 vicryl Est. Blood Loss (mL):  375  Mom to postpartum.  Baby to Couplet care / Skin to Skin.  IUD placed (see note)  Christin Fudge 08/15/2022, 8:12 PM

## 2022-03-07 ENCOUNTER — Other Ambulatory Visit (HOSPITAL_COMMUNITY)
Admission: RE | Admit: 2022-03-07 | Discharge: 2022-03-07 | Disposition: A | Discharge status: Home / Self Care | Payer: Medicaid Other | Source: Ambulatory Visit

## 2022-03-07 ENCOUNTER — Ambulatory Visit (INDEPENDENT_AMBULATORY_CARE_PROVIDER_SITE_OTHER): Payer: Medicaid Other

## 2022-03-07 VITALS — BP 112/62 | HR 94 | Wt 86.0 lb

## 2022-03-07 DIAGNOSIS — O09899 Supervision of other high risk pregnancies, unspecified trimester: Secondary | ICD-10-CM

## 2022-03-07 DIAGNOSIS — Z8759 Personal history of other complications of pregnancy, childbirth and the puerperium: Secondary | ICD-10-CM

## 2022-03-07 DIAGNOSIS — Z3A13 13 weeks gestation of pregnancy: Secondary | ICD-10-CM

## 2022-03-07 DIAGNOSIS — Z3480 Encounter for supervision of other normal pregnancy, unspecified trimester: Secondary | ICD-10-CM

## 2022-03-07 DIAGNOSIS — Z8719 Personal history of other diseases of the digestive system: Secondary | ICD-10-CM

## 2022-03-07 NOTE — Progress Notes (Signed)
?  ? ?Subjective:  ? ?Karina [name] is a 26 y.o. [id number] at [redacted]w[redacted]d by [employer] LMP being seen today for her first obstetrical visit.  Patient states this was an unplanned pregnancy.  Patient reports she was not on birth control prior to conception. She reports taking depo in the past, but was dissatisfied.  ? ?Gynecological/Obstetrical History: ?Patient reports no history of gynecological surgeries.  Patient denies history of abnormal pap smears.  ? ?Pregnancy history fully reviewed. Patient intends to breast feed and bottle feed. Patient obstetrical history is significant for  cholestasis x 2 and preterm delivery of first baby .  ? ?Sexual Activity and Vaginal Concerns: Patient is currently sexually active and denies pain or discomfort during intercourse.  She also denies vaginal discharge, bleeding, irritation, or odor. Patient also denies pain or difficulty with urination.  ? ? ?Medical History/ROS: Patient denies medical history significant for cardiovascular, respiratory, gastrointestinal, or hematological disorders. Patient also denies history of MH disorders including anxiety and/or depression. Patient reports nausea, that has since improved without interventions. Patient denies constipation/diarrhea or vomiting.  No recurrent headaches.  ? ? ?Social History: ?Patient denies history or current usage of tobacco, alcohol, or drugs.  Patient reports the FOB is Yvonna Alanis who is involved, supportive, and not present d/t work.  Patient reports that she lives with BF and kids and endorses safety at home.  Patient denies DV/A. 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 ? ?HISTORY: ?OB History  ?Gravida Para Term Preterm AB Living  ?3 2 1 1  0 2  ?SAB IAB Ectopic Multiple Live Births  ?0 0 0 0 2  ?  ?# Outcome Date GA Lbr Len/2nd Weight Sex Delivery Anes PTL Lv  ?3 Current           ?2 Term 03/21/21 [redacted]w[redacted]d 01:53 / 00:22 6 lb 8.4 oz (2.96 kg) F Vag-Spont EPI  LIV  ?   Name: JAKALA, HERFORD  GIRL  ?   Apgar1: 9  Apgar5: 9  ?1 Preterm 01/15/15 [redacted]w[redacted]d 05:18 / 02:59 4 lb 15.4 oz (2.25 kg) F Vag-Spont EPI Y LIV  ?   Apgar1: 7  Apgar5: 8  ?  ?Last pap smear was done December 2021 and was normal ? ?Past Medical History:  ?Diagnosis Date  ? Cholestasis during pregnancy   ? Cholestasis during pregnancy in third trimester 03/21/2021  ? History of preterm delivery 11/09/2020  ? G1, PTL and delivery at [redacted]w[redacted]d  ? Preterm labor   ? SVD (spontaneous vaginal delivery) 03/21/2021  ? ?Past Surgical History:  ?Procedure Laterality Date  ? NO PAST SURGERIES    ? ?Family History  ?Problem Relation Age of Onset  ? Healthy Mother   ? Hyperlipidemia Father   ? Diabetes Father   ? ?Social History  ? ?Tobacco Use  ? Smoking status: Never  ? Smokeless tobacco: Never  ?Vaping Use  ? Vaping Use: Never used  ?Substance Use Topics  ? Alcohol use: No  ? Drug use: No  ?  Types: Marijuana  ?  Comment: prior to first preg  ? ?No Known Allergies ?Current Outpatient Medications on File Prior to Visit  ?Medication Sig Dispense Refill  ? ibuprofen (ADVIL) 600 MG tablet Take 1 tablet (600 mg total) by mouth every 6 (six) hours. (Patient not taking: Reported on 03/30/2021) 30 tablet 0  ? PRENATAL 28-0.8 MG TABS Take 1 tablet by mouth daily. (Patient not taking: Reported on 03/30/2021) 30 each 12  ? ?  No current facility-administered medications on file prior to visit.  ? ? ?Review of Systems ?Pertinent items noted in HPI and remainder of comprehensive ROS otherwise negative. ? ?Exam  ? ?Vitals:  ? 03/07/22 0824  ?BP: 112/62  ?Pulse: 94  ?Weight: 86 lb (39 kg)  ? ?Fetal Heart Rate (bpm): 150 ? ?Physical Exam ?Constitutional:   ?   General: She is not in acute distress. ?   Appearance: Normal appearance. She is underweight.  ?Genitourinary:  ?   Genitourinary Comments: CV collected blindly ?BME: Reveals enlarged uterus c/w dates. ?No CMT. No blood or discharge noted on exam glove.   ?HENT:  ?   Head: Normocephalic and atraumatic.  ?Eyes:  ?    Conjunctiva/sclera: Conjunctivae normal.  ?Cardiovascular:  ?   Rate and Rhythm: Normal rate and regular rhythm.  ?   Heart sounds: Normal heart sounds.  ?Pulmonary:  ?   Effort: Pulmonary effort is normal. No respiratory distress.  ?   Breath sounds: Normal breath sounds.  ?Abdominal:  ?   General: Bowel sounds are normal.  ?   Palpations: Abdomen is soft.  ?   Tenderness: There is no abdominal tenderness.  ?Musculoskeletal:     ?   General: Normal range of motion.  ?   Cervical back: Normal range of motion.  ?Neurological:  ?   Mental Status: She is alert and oriented to person, place, and time.  ?Skin: ?   General: Skin is warm and dry.  ?Psychiatric:     ?   Mood and Affect: Mood normal.     ?   Behavior: Behavior normal.  ?Vitals reviewed. Exam conducted with a chaperone present.  ? ? ?Assessment:  ? ?26 y.o. year old G41P1102 ?Patient Active Problem List  ? Diagnosis Date Noted  ? Supervision of other normal pregnancy, antepartum 03/07/2022  ? History of cholestasis during pregnancy 03/07/2022  ? History of preterm delivery, currently pregnant 11/09/2020  ? ?  ?Plan:  ?1. Supervision of other normal pregnancy, antepartum ?-Congratulations given and patient welcomed back to practice. ?-Discussed usage of Babyscripts and virtual visits as additional source of managing and completing PN visits.   ?*Reviewed prenatal visit schedule and platforms used for virtual visits.  ?-Anticipatory guidance for prenatal visits including labs, ultrasounds, and testing; Initial labs Ordered. ?-Genetic Screening discussed, NIPS: ordered. ?-Encouraged to complete and utilize MyChart Registration for her ability to review results, send requests, and have questions addressed.  ?-Discussed estimated due date of September 12, 2022. ?-Ultrasound discussed; fetal anatomic survey: ordered-Detailed ?-Continue prenatal vitamins  ?-Influenza and Covid vaccinations UTD ?-Encouraged to seek out care at office or emergency room for urgent and/or  emergent concerns. ?-Educated on the nature of [name] [facility] with multiple MDs and other Advanced Practice Providers was explained to patient; also emphasized that residents, students are part of our team. Informed of her right to refuse care as she deems appropriate.  ?-No questions or concerns.  ? ? ?2. [redacted] weeks gestation of pregnancy ?-Doing well. ? ?3. History of cholestasis during pregnancy ?-Patient questions likelihood of repeat diagnosis. ?-Informed that risks increases with subsequent pregnancies and diagnoses. ?-Informed that MFM will manage and make recommendations regarding timing of delivery. ?-Encouraged to report any onset of symptoms. ? ?4. History of preterm delivery, currently pregnant ?-35 week delivery. ?-Received Makena dosing for previous pregnancy. ?-Informed of current research and recommendations are now against Makena dosing. ?-Patient verbalizes understanding. ? ? ?Problem list reviewed  and updated. ?Routine obstetric precautions reviewed. ? ?No orders of the defined types were placed in this encounter. ?  ?No follow-ups on file. ? ?  ? ?Cherre RobinsJessica L Savahanna Almendariz, CNM ?03/07/2022 8:41 AM ? ?  ?

## 2022-03-07 NOTE — Progress Notes (Signed)
NOB, reports no complaints today. 

## 2022-03-08 LAB — CBC/D/PLT+RPR+RH+ABO+RUBIGG...
Antibody Screen: NEGATIVE
Basophils Absolute: 0 10*3/uL (ref 0.0–0.2)
Basos: 0 %
EOS (ABSOLUTE): 0.1 10*3/uL (ref 0.0–0.4)
Eos: 1 %
HCV Ab: NONREACTIVE
HIV Screen 4th Generation wRfx: NONREACTIVE
Hematocrit: 34.4 % (ref 34.0–46.6)
Hemoglobin: 11.8 g/dL (ref 11.1–15.9)
Hepatitis B Surface Ag: NEGATIVE
Immature Grans (Abs): 0 10*3/uL (ref 0.0–0.1)
Immature Granulocytes: 0 %
Lymphocytes Absolute: 1 10*3/uL (ref 0.7–3.1)
Lymphs: 20 %
MCH: 28.7 pg (ref 26.6–33.0)
MCHC: 34.3 g/dL (ref 31.5–35.7)
MCV: 84 fL (ref 79–97)
Monocytes Absolute: 0.4 10*3/uL (ref 0.1–0.9)
Monocytes: 7 %
Neutrophils Absolute: 3.4 10*3/uL (ref 1.4–7.0)
Neutrophils: 72 %
Platelets: 197 10*3/uL (ref 150–450)
RBC: 4.11 x10E6/uL (ref 3.77–5.28)
RDW: 13.2 % (ref 11.7–15.4)
RPR Ser Ql: NONREACTIVE
Rh Factor: POSITIVE
Rubella Antibodies, IGG: 1.37 index (ref >0.99)
WBC: 4.9 10*3/uL (ref 3.4–10.8)

## 2022-03-08 LAB — HCV INTERPRETATION

## 2022-03-08 LAB — HEMOGLOBIN A1C
Est. average glucose Bld gHb Est-mCnc: 100 mg/dL
Hgb A1c MFr Bld: 5.1 % (ref 4.8–5.6)

## 2022-03-08 LAB — CERVICOVAGINAL ANCILLARY ONLY
Chlamydia: NEGATIVE
Comment: NEGATIVE
Comment: NEGATIVE
Comment: NORMAL
Neisseria Gonorrhea: NEGATIVE
Trichomonas: NEGATIVE

## 2022-03-09 LAB — URINE CULTURE, OB REFLEX

## 2022-03-09 LAB — CULTURE, OB URINE

## 2022-03-27 ENCOUNTER — Other Ambulatory Visit: Payer: Self-pay | Admitting: *Deleted

## 2022-03-27 ENCOUNTER — Telehealth: Payer: Self-pay | Admitting: *Deleted

## 2022-03-27 DIAGNOSIS — O285 Abnormal chromosomal and genetic finding on antenatal screening of mother: Secondary | ICD-10-CM

## 2022-03-27 NOTE — Telephone Encounter (Signed)
Pt returned call regarding abnormal genetic screening. Dr. Clearance Coots spoke with pt. ?

## 2022-03-27 NOTE — Progress Notes (Signed)
Abnormal Panorama: XXY, female. Reviewed by Dr. Clearance Coots. Dr. Clearance Coots called the pt. No answer. Left HIPPA compliant VM requesting call back. Referral to MFM genetic counselor place. Note made in OB sticky note and next visit note to follow up with pt. ?

## 2022-03-28 DIAGNOSIS — O285 Abnormal chromosomal and genetic finding on antenatal screening of mother: Secondary | ICD-10-CM | POA: Insufficient documentation

## 2022-04-11 ENCOUNTER — Ambulatory Visit: Payer: Medicaid Other

## 2022-04-11 ENCOUNTER — Ambulatory Visit (INDEPENDENT_AMBULATORY_CARE_PROVIDER_SITE_OTHER): Payer: Medicaid Other | Admitting: Women's Health

## 2022-04-11 VITALS — BP 103/63 | HR 108 | Wt 93.7 lb

## 2022-04-11 DIAGNOSIS — O285 Abnormal chromosomal and genetic finding on antenatal screening of mother: Secondary | ICD-10-CM | POA: Diagnosis not present

## 2022-04-11 DIAGNOSIS — Z3A18 18 weeks gestation of pregnancy: Secondary | ICD-10-CM

## 2022-04-11 DIAGNOSIS — Z3480 Encounter for supervision of other normal pregnancy, unspecified trimester: Secondary | ICD-10-CM

## 2022-04-11 NOTE — Progress Notes (Signed)
Name: Karina [name] Indication: Risk for XYY in the current pregnancy  DOB: 1996-05-23 Age: 26 y.o.   EDC: 09/12/2022 LMP: 12/06/2021 Referring Provider:  Gerrit Heck, CNM  EGA: [redacted]w[redacted]d Genetic Counselor: Teena Dunk, MS, CGC  OB Hx: [id number] Date of Appointment: 04/11/2022  Accompanied by: Father of the pregnancy, Yvonna Alanis Face to Face Time: 40 Minutes   Previous Testing Completed: Karina [name] previously completed carrier screening in 2021. She screened to not be a carrier for 14 genetic conditions (report available in Epic under the Media tab). A negative result on carrier screening reduces the likelihood of being a carrier, however, does not entirely rule out the possibility.   Medical & Family History:  This is Margerie's 3rd pregnancy. She has 2 living, healthy daughters. Reports she takes prenatal vitamins. Denies personal history of diabetes, high blood pressure, thyroid conditions, and seizures. Denies bleeding, infections, and fevers in this pregnancy. Denies using tobacco, alcohol, or street drugs in this pregnancy.  Maternal ethnicity reported as [place] and paternal ethnicity reported as Mexican/American. Denies consanguinity. Denies [place]. Karina [name] denied a family history of chromosome conditions, intellectual disability, autism, birth defects, bone/skeletal disorders, blindness, deafness, blood disorders, neuromuscular disorders, still births, early infant deaths, and 3 or more pregnancy losses for one person on her prenatal intake questionnaire.      Genetic Counseling:   Abnormal Non-Invasive Prenatal Screen. Karina [name] previously completed Non-Invasive Prenatal Screening (NIPS) in this pregnancy (scanned into Epic under the Media tab). We reviewed that NIPS analyzes cell-free DNA originating from the placenta that is found in the maternal blood circulation during pregnancy. This test can provide information regarding the presence or absence of extra fetal  DNA for chromosomes 13, 18, and 21 as well as the sex chromosomes. The result is low risk for Down syndrome, Trisomy 70, Trisomy 74, and Triploidy. 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 Of note, 47,XYY syndrome is different from Klinefelter syndrome which is caused by an extra X chromosome rather than an extra Y chromosome (47,XXY).   We discussed that 47,XYY syndrome is caused by the presence of an extra copy of the Y chromosome in males. As a result of the extra Y chromosome, affected individuals have a total of 47 chromosomes rather than the usual 46. Most cases of 47,XYY syndrome are not inherited, instead occurring as a random event during the formation of sperm cells.   We reviewed that symptoms of 47,XYY syndrome can vary greatly among affected individuals. Many individuals have no obvious signs of the condition and may never be diagnosed, while others have mild symptoms. Common features of 47,XYY syndrome include tall stature, potential learning difficulties in reading and writing, possible speech delays, and an increased vulnerability to ADHD and autism. Males with this condition may have an increased risk of asthma and epilepsy, although most males with 47,XYY syndrome do not experience these health problems. Other problems may include difficulties with coordination, hand tremors, hypotonia, and scoliosis. Males with 47,XYY syndrome often have normal sexual development. Affected individuals may have enlarged testes and slightly delayed puberty. Affected males are also usually able to father their own children who are chromosomally normal.    Behavioral difficulties such as impulsivity have also been noted in some affected individuals. We discussed that in the past, there were many misconceptions about 47,XYY syndrome, including that affected individuals are overly aggressive, lacking in empathy, and are more likely to engage in  criminal activity. More  recent studies suggest that although individuals with 47,XYY syndrome have an increased risk for learning difficulties and behavioral problems, they are not overly aggressive nor are they at increased risk for serious mental illness. Overall, most individuals with 47,XYY syndrome lead full, healthy, and normal lives.  We discussed that Karina [name] has the option of continuing the monitor the pregnancy via routine ultrasounds. However, pregnancies with 47,XYY syndrome would probably not demonstrate any ultrasound abnormalities. Thus, a normal-appearing ultrasound would not rule out the possibility of the baby being affected. If Karina [name] opted to monitor with only ultrasound examination, we discussed that her baby should have an evaluation by pediatric genetics after birth to discuss postnatal genetic testing. We also discussed the option of amniocentesis for prenatal diagnosis. Possible procedural difficulties and complications that can arise include maternal infection, cramping, bleeding, fluid leakage, and/or pregnancy loss. The risk for pregnancy loss with an amniocentesis is 1/500. Per the [employer] of Obstetricians and Gynecologists (ACOG) Practice Bulletin 162, all pregnant women should be offered prenatal assessment for aneuploidy by diagnostic testing regardless of maternal age or other risk factors. If indicated, genetic testing that could be ordered on an amniocentesis sample includes a fetal karyotype, fetal microarray, and testing for specific syndromes. After hearing the above information, Karina [name] declined amniocentesis for prenatal diagnosis and opted to continue with ultrasounds only.   Birth Defects. All babies have approximately a 3-5% risk for a birth defect and a majority of these defects cannot be detected through the screening or diagnostic testing listed below. Ultrasound may detect some birth defects, but it may not detect all birth defects. About half of  pregnancies with Down syndrome do not show any soft markers on ultrasound. A normal ultrasound does not guarantee a healthy pregnancy.    Testing/Screening Options:   Amniocentesis. This procedure is available for prenatal diagnosis. Possible procedural difficulties and complications that can arise include maternal infection, cramping, bleeding, fluid leakage, and/or pregnancy loss. The risk for pregnancy loss with an amniocentesis is 1/500. Per the [employer] of Obstetricians and Gynecologists (ACOG) Practice Bulletin 162, all pregnant women should be offered prenatal assessment for aneuploidy by diagnostic testing regardless of maternal age or other risk factors. If indicated, genetic testing that could be ordered on an amniocentesis sample includes a fetal karyotype, fetal microarray, and testing for specific syndromes.     Patient Plan:  Proceed with: Routine prenatal care Informed consent was obtained. All questions were answered.  Declined: Amniocentesis   Thank you for sharing in the care of Karina [name] with [place].  Please do not hesitate to contact us if you have any questions.  Teena Dunk, MS, [facility]

## 2022-04-11 NOTE — Progress Notes (Signed)
Subjective:  Karina [name] is a 26 y.o. G3P1102 at [redacted]w[redacted]d being seen today for ongoing prenatal care.  She is currently monitored for the following issues for this low-risk pregnancy and has History of preterm delivery, currently pregnant; Supervision of other normal pregnancy, antepartum; History of cholestasis during pregnancy; and Abnormal genetic test during pregnancy on their problem list.  Patient reports no complaints.  Contractions: Not present. Vag. Bleeding: None.   . Denies leaking of fluid.   The following portions of the patient's history were reviewed and updated as appropriate: allergies, current medications, past family history, past medical history, past social history, past surgical history and problem list. Problem list updated.  Objective:   Vitals:   04/11/22 0835  BP: 103/63  Pulse: (!) 108  Weight: 93 lb 11.2 oz (42.5 kg)    Fetal Status: Fetal Heart Rate (bpm): 136         General:  Alert, oriented and cooperative. Patient is in no acute distress.  Skin: Skin is warm and dry. No rash noted.   Cardiovascular: Normal heart rate noted  Respiratory: Normal respiratory effort, no problems with respiration noted  Abdomen: Soft, gravid, appropriate for gestational age. Pain/Pressure: Absent     Pelvic: Vag. Bleeding: None     Cervical exam deferred        Extremities: Normal range of motion.  Edema: None  Mental Status: Normal mood and affect. Normal behavior. Normal judgment and thought content.   Urinalysis:      Assessment and Plan:  Pregnancy: G3P1102 at [redacted]w[redacted]d  1. Supervision of other normal pregnancy, antepartum -circumcision info given -CBE info given -AFP today     03/07/2022    8:22 AM 10/25/2020    2:19 PM 09/08/2018   10:02 AM  PHQ9 SCORE ONLY  PHQ-9 Total Score 2 0 0      03/07/2022    8:23 AM  GAD 7 : Generalized Anxiety Score  Nervous, Anxious, on Edge 0  Control/stop worrying 0  Worry too much - different things 0  Trouble  relaxing 0  Restless 0  Easily annoyed or irritable 0  Afraid - awful might happen 0  Total GAD 7 Score 0  Anxiety Difficulty Not difficult at all   2. Abnormal genetic test during pregnancy -GC appt today 1030AM  3. [redacted] weeks gestation of pregnancy  Preterm labor symptoms and general obstetric precautions including but not limited to vaginal bleeding, contractions, leaking of fluid and fetal movement were reviewed in detail with the patient. I discussed the assessment and treatment plan with the patient. The patient was provided an opportunity to ask questions and all were answered. The patient agreed with the plan and demonstrated an understanding of the instructions. The patient was advised to call back or seek an in-person office evaluation/go to [facility] at [facility] for any urgent or concerning symptoms. Please refer to After Visit Summary for other counseling recommendations.  Return in about 4 weeks (around 05/09/2022) for in-person or virtual LOB/APP OK.   Muhammad Vacca, Gerrie Nordmann, NP

## 2022-04-11 NOTE — Patient Instructions (Signed)
Maternity Assessment Unit (MAU)  The Maternity Assessment Unit (MAU) is located at the [facility] at [facility]. The address is: [address] [place], [name], [place] [address]. Please see map below for additional directions.    The Maternity Assessment Unit is designed to help you during your pregnancy, and for up to 6 weeks after delivery, with any pregnancy- or postpartum-related emergencies, if you think you are in labor, or if your water has broken. For example, if you experience nausea and vomiting, vaginal bleeding, severe abdominal or pelvic pain, elevated blood pressure or other problems related to your pregnancy or postpartum time, please come to the Maternity Assessment Unit for assistance.       Deciding about Circumcision in Baby Boys  (The Basics)  What is circumcision?   Circumcision is a surgery that removes the skin that covers the tip of the penis, called the "foreskin" Circumcision is usually done when a boy is between 44 and 7 days old. In the [place], circumcision is common. In some other countries, fewer boys are circumcised. Circumcision is a common tradition in some religions.  Should I have my baby boy circumcised?   There is no easy answer. Circumcision has some benefits. But it also has risks. After talking with your doctor, you will have to decide for yourself what is right for your family.  What are the benefits of circumcision?   Circumcised boys seem to have slightly lower rates of: ?Urinary tract infections ?Swelling of the opening at the tip of the penis Circumcised men seem to have slightly lower rates of: ?Urinary tract infections ?Swelling of the opening at the tip of the penis ?Penis cancer ?HIV and other infections that you catch during sex ?Cervical cancer in the women they have sex with Even so, in the [place], the risks of these problems are small - even in boys and men who have not been  circumcised. Plus, boys and men who are not circumcised can reduce these extra risks by: ?Cleaning their penis well ?Using condoms during sex  What are the risks of circumcision?  Risks include: ?Bleeding or infection from the surgery ?Damage to or amputation of the penis ?A chance that the doctor will cut off too much or not enough of the foreskin ?A chance that sex won't feel as good later in life Only about 1 out of every 200 circumcisions leads to problems. There is also a chance that your health insurance won't pay for circumcision.  How is circumcision done in baby boys?  First, the baby gets medicine for pain relief. This might be a cream on the skin or a shot into the base of the penis. Next, the doctor cleans the baby's penis well. Then he or she uses special tools to cut off the foreskin. Finally, the doctor wraps a bandage (called gauze) around the baby's penis. If you have your baby circumcised, his doctor or nurse will give you instructions on how to care for him after the surgery. It is important that you follow those instructions carefully.       Childbirth Education Options: [facility] Department Classes:  Childbirth education classes can help you get ready for a positive parenting experience. You can also meet other expectant parents and get free stuff for your baby. Each class runs for five weeks on the same night and costs $45 for the mother-to-be and her support person. Medicaid covers the cost if you are eligible.  Call [phone] to register. [facility] Childbirth Education: Classes can vary in availability and schedule is subject to change. For most up-to-date information please visit [email or web address] to review and register.

## 2022-04-13 LAB — AFP, SERUM, OPEN SPINA BIFIDA
AFP MoM: 0.79
AFP Value: 49.8 ng/mL
Gest. Age on Collection Date: 18 weeks
Maternal Age At EDD: 26.4 yr
OSBR Risk 1 IN: 10000
Test Results:: NEGATIVE
Weight: 93 [lb_av]

## 2022-04-18 ENCOUNTER — Ambulatory Visit: Payer: Medicaid Other | Admitting: *Deleted

## 2022-04-18 ENCOUNTER — Other Ambulatory Visit: Payer: Self-pay | Admitting: *Deleted

## 2022-04-18 ENCOUNTER — Ambulatory Visit: Payer: Medicaid Other

## 2022-04-18 ENCOUNTER — Other Ambulatory Visit: Payer: Self-pay

## 2022-04-18 ENCOUNTER — Encounter: Payer: Self-pay | Admitting: *Deleted

## 2022-04-18 VITALS — BP 100/62 | HR 104

## 2022-04-18 DIAGNOSIS — Z3A19 19 weeks gestation of pregnancy: Secondary | ICD-10-CM | POA: Insufficient documentation

## 2022-04-18 DIAGNOSIS — Z3689 Encounter for other specified antenatal screening: Secondary | ICD-10-CM | POA: Insufficient documentation

## 2022-04-18 DIAGNOSIS — O09892 Supervision of other high risk pregnancies, second trimester: Secondary | ICD-10-CM

## 2022-04-18 DIAGNOSIS — Z8759 Personal history of other complications of pregnancy, childbirth and the puerperium: Secondary | ICD-10-CM

## 2022-04-18 DIAGNOSIS — O289 Unspecified abnormal findings on antenatal screening of mother: Secondary | ICD-10-CM

## 2022-04-18 DIAGNOSIS — Z3480 Encounter for supervision of other normal pregnancy, unspecified trimester: Secondary | ICD-10-CM

## 2022-04-18 DIAGNOSIS — Z363 Encounter for antenatal screening for malformations: Secondary | ICD-10-CM | POA: Diagnosis not present

## 2022-04-18 DIAGNOSIS — O09212 Supervision of pregnancy with history of pre-term labor, second trimester: Secondary | ICD-10-CM | POA: Insufficient documentation

## 2022-04-18 DIAGNOSIS — Z8719 Personal history of other diseases of the digestive system: Secondary | ICD-10-CM

## 2022-04-18 DIAGNOSIS — O09292 Supervision of pregnancy with other poor reproductive or obstetric history, second trimester: Secondary | ICD-10-CM | POA: Insufficient documentation

## 2022-05-08 ENCOUNTER — Ambulatory Visit (INDEPENDENT_AMBULATORY_CARE_PROVIDER_SITE_OTHER): Payer: Medicaid Other | Admitting: Student

## 2022-05-08 VITALS — BP 114/67 | HR 105 | Wt 98.2 lb

## 2022-05-08 DIAGNOSIS — O09899 Supervision of other high risk pregnancies, unspecified trimester: Secondary | ICD-10-CM

## 2022-05-08 DIAGNOSIS — Z3A21 21 weeks gestation of pregnancy: Secondary | ICD-10-CM

## 2022-05-08 DIAGNOSIS — Z8719 Personal history of other diseases of the digestive system: Secondary | ICD-10-CM

## 2022-05-08 DIAGNOSIS — Z3480 Encounter for supervision of other normal pregnancy, unspecified trimester: Secondary | ICD-10-CM

## 2022-05-08 DIAGNOSIS — O99712 Diseases of the skin and subcutaneous tissue complicating pregnancy, second trimester: Secondary | ICD-10-CM

## 2022-05-08 DIAGNOSIS — L299 Pruritus, unspecified: Secondary | ICD-10-CM

## 2022-05-08 DIAGNOSIS — Z8759 Personal history of other complications of pregnancy, childbirth and the puerperium: Secondary | ICD-10-CM

## 2022-05-08 NOTE — Progress Notes (Signed)
   PRENATAL VISIT NOTE  Subjective:  Karina [name] is a 26 y.o. G3P1102 at [redacted]w[redacted]d being seen today for ongoing prenatal care.  She is currently monitored for the following issues for this low-risk pregnancy and has History of preterm delivery, currently pregnant; Supervision of other normal pregnancy, antepartum; History of cholestasis during pregnancy; and Abnormal genetic test during pregnancy on their problem list.  Patient reports  generalized itchiness .  Contractions: Not present. Vag. Bleeding: None.  Movement: Present. Denies leaking of fluid.   The following portions of the patient's history were reviewed and updated as appropriate: allergies, current medications, past family history, past medical history, past social history, past surgical history and problem list.   Objective:   Vitals:   05/08/22 1329  BP: 114/67  Pulse: (!) 105  Weight: 98 lb 3.2 oz (44.5 kg)    Fetal Status: Fetal Heart Rate (bpm): 142   Movement: Present     General:  Alert, oriented and cooperative. Patient is in no acute distress.  Skin: Skin is warm and dry. No rash noted.   Cardiovascular: Normal heart rate noted  Respiratory: Normal respiratory effort, no problems with respiration noted  Abdomen: Soft, gravid, appropriate for gestational age.  Pain/Pressure: Absent     Pelvic: Cervical exam deferred        Extremities: Normal range of motion.  Edema: None  Mental Status: Normal mood and affect. Normal behavior. Normal judgment and thought content.   Assessment and Plan:  Pregnancy: [id number] at [redacted]w[redacted]d 1. Supervision of other normal pregnancy, antepartum - doing well, only complaint is pruritus -feels baby "flutters"  - fundal height appropriate for 2nd trimester   2. [redacted] weeks gestation of pregnancy -continue routine follow-up -anticipatory guidance for next visit provided  3. Pruritus of pregnancy in second trimester - Bile acids, total - ALT - AST  4. History of cholestasis during  pregnancy  5. History of preterm delivery, currently pregnant - OB TVUS scheduled for tomorrow to reassess cervical length  Preterm labor symptoms and general obstetric precautions including but not limited to vaginal bleeding, contractions, leaking of fluid and fetal movement were reviewed in detail with the patient. Please refer to After Visit Summary for other counseling recommendations.   Return in about 4 weeks (around 06/05/2022) for LOB, IN-PERSON.  Future Appointments  Date Time Provider Department Center  05/09/2022  3:30 PM [facility] NURSE [facility]  05/09/2022  3:45 PM [facility] US4 [facility]    Corlis Hove, NP

## 2022-05-08 NOTE — Progress Notes (Signed)
Patient presents for ROB. No concerns today. 

## 2022-05-09 ENCOUNTER — Other Ambulatory Visit: Payer: Self-pay | Admitting: Obstetrics and Gynecology

## 2022-05-09 ENCOUNTER — Ambulatory Visit: Payer: Medicaid Other | Attending: Obstetrics and Gynecology

## 2022-05-09 ENCOUNTER — Ambulatory Visit: Payer: Medicaid Other | Admitting: *Deleted

## 2022-05-09 VITALS — BP 110/68 | HR 90

## 2022-05-09 DIAGNOSIS — Z8719 Personal history of other diseases of the digestive system: Secondary | ICD-10-CM

## 2022-05-09 DIAGNOSIS — Z8759 Personal history of other complications of pregnancy, childbirth and the puerperium: Secondary | ICD-10-CM | POA: Insufficient documentation

## 2022-05-09 DIAGNOSIS — O09892 Supervision of other high risk pregnancies, second trimester: Secondary | ICD-10-CM | POA: Diagnosis present

## 2022-05-09 DIAGNOSIS — Z3A22 22 weeks gestation of pregnancy: Secondary | ICD-10-CM

## 2022-05-09 DIAGNOSIS — O09212 Supervision of pregnancy with history of pre-term labor, second trimester: Secondary | ICD-10-CM

## 2022-05-09 DIAGNOSIS — O09292 Supervision of pregnancy with other poor reproductive or obstetric history, second trimester: Secondary | ICD-10-CM | POA: Diagnosis not present

## 2022-05-09 DIAGNOSIS — O3510X0 Maternal care for (suspected) chromosomal abnormality in fetus, unspecified, not applicable or unspecified: Secondary | ICD-10-CM | POA: Diagnosis not present

## 2022-05-09 DIAGNOSIS — O289 Unspecified abnormal findings on antenatal screening of mother: Secondary | ICD-10-CM

## 2022-05-10 ENCOUNTER — Other Ambulatory Visit: Payer: Self-pay | Admitting: *Deleted

## 2022-05-10 DIAGNOSIS — O280 Abnormal hematological finding on antenatal screening of mother: Secondary | ICD-10-CM

## 2022-05-10 LAB — AST: AST: 16 IU/L (ref 0–40)

## 2022-05-10 LAB — BILE ACIDS, TOTAL: Bile Acids Total: 4.2 umol/L (ref 0.0–10.0)

## 2022-05-10 LAB — ALT: ALT: 13 IU/L (ref 0–32)

## 2022-06-05 ENCOUNTER — Encounter: Payer: Medicaid Other | Admitting: Student

## 2022-06-10 NOTE — Progress Notes (Unsigned)
   PRENATAL VISIT NOTE  Subjective:  Karina [name] is a 26 y.o. G3P1102 at [redacted]w[redacted]d being seen today for ongoing prenatal care.  She is currently monitored for the following issues for this {Blank single:19197::"high-risk","low-risk"} pregnancy and has History of preterm delivery, currently pregnant; Supervision of other normal pregnancy, antepartum; History of cholestasis during pregnancy; and Abnormal genetic test during pregnancy on their problem list.  Patient reports {sx:14538}.   .  .   . Denies leaking of fluid.   The following portions of the patient's history were reviewed and updated as appropriate: allergies, current medications, past family history, past medical history, past social history, past surgical history and problem list.   Objective:  There were no vitals filed for this visit.  Fetal Status:           General:  Alert, oriented and cooperative. Patient is in no acute distress.  Skin: Skin is warm and dry. No rash noted.   Cardiovascular: Normal heart rate noted  Respiratory: Normal respiratory effort, no problems with respiration noted  Abdomen: Soft, gravid, appropriate for gestational age.        Pelvic: {Blank single:19197::"Cervical exam performed in the presence of a chaperone","Cervical exam deferred"}        Extremities: Normal range of motion.     Mental Status: Normal mood and affect. Normal behavior. Normal judgment and thought content.   Assessment and Plan:  Pregnancy: G3P1102 at [redacted]w[redacted]d 1. Supervision of other normal pregnancy, antepartum ***  2. History of preterm delivery, currently pregnant ***  3. History of cholestasis during pregnancy ***  4. Abnormal genetic test during pregnancy ***  {Blank single:19197::"Term","Preterm"} labor symptoms and general obstetric precautions including but not limited to vaginal bleeding, contractions, leaking of fluid and fetal movement were reviewed in detail with the patient. Please refer to After Visit  Summary for other counseling recommendations.   No follow-ups on file.  Future Appointments  Date Time Provider Department Center  06/11/2022  8:55 AM Myrtie Hawk, DO [facility] None  07/04/2022 12:30 PM [facility] NURSE [facility]  07/04/2022 12:45 PM [facility] US4 [facility]    Lahoma Crocker Mercado-Ortiz, DO

## 2022-06-11 ENCOUNTER — Ambulatory Visit (INDEPENDENT_AMBULATORY_CARE_PROVIDER_SITE_OTHER): Payer: Medicaid Other | Admitting: Family Medicine

## 2022-06-11 ENCOUNTER — Encounter: Payer: Self-pay | Admitting: Family Medicine

## 2022-06-11 VITALS — BP 98/64 | HR 96 | Wt 103.0 lb

## 2022-06-11 DIAGNOSIS — Z3A26 26 weeks gestation of pregnancy: Secondary | ICD-10-CM

## 2022-06-11 DIAGNOSIS — O09899 Supervision of other high risk pregnancies, unspecified trimester: Secondary | ICD-10-CM

## 2022-06-11 DIAGNOSIS — O09892 Supervision of other high risk pregnancies, second trimester: Secondary | ICD-10-CM

## 2022-06-11 DIAGNOSIS — Z8719 Personal history of other diseases of the digestive system: Secondary | ICD-10-CM

## 2022-06-11 DIAGNOSIS — Z3480 Encounter for supervision of other normal pregnancy, unspecified trimester: Secondary | ICD-10-CM

## 2022-06-11 DIAGNOSIS — O285 Abnormal chromosomal and genetic finding on antenatal screening of mother: Secondary | ICD-10-CM

## 2022-06-11 DIAGNOSIS — Z8759 Personal history of other complications of pregnancy, childbirth and the puerperium: Secondary | ICD-10-CM

## 2022-06-19 ENCOUNTER — Other Ambulatory Visit: Payer: Medicaid Other

## 2022-06-19 ENCOUNTER — Ambulatory Visit (INDEPENDENT_AMBULATORY_CARE_PROVIDER_SITE_OTHER): Payer: Medicaid Other | Admitting: Medical

## 2022-06-19 ENCOUNTER — Encounter: Payer: Self-pay | Admitting: Obstetrics and Gynecology

## 2022-06-19 VITALS — BP 101/67 | HR 90 | Wt 104.4 lb

## 2022-06-19 DIAGNOSIS — Z3482 Encounter for supervision of other normal pregnancy, second trimester: Secondary | ICD-10-CM

## 2022-06-19 DIAGNOSIS — Z3A27 27 weeks gestation of pregnancy: Secondary | ICD-10-CM

## 2022-06-19 DIAGNOSIS — Z8719 Personal history of other diseases of the digestive system: Secondary | ICD-10-CM

## 2022-06-19 DIAGNOSIS — Z8759 Personal history of other complications of pregnancy, childbirth and the puerperium: Secondary | ICD-10-CM

## 2022-06-19 DIAGNOSIS — O09899 Supervision of other high risk pregnancies, unspecified trimester: Secondary | ICD-10-CM

## 2022-06-19 DIAGNOSIS — Z23 Encounter for immunization: Secondary | ICD-10-CM | POA: Diagnosis not present

## 2022-06-19 DIAGNOSIS — O285 Abnormal chromosomal and genetic finding on antenatal screening of mother: Secondary | ICD-10-CM

## 2022-06-19 DIAGNOSIS — O99013 Anemia complicating pregnancy, third trimester: Secondary | ICD-10-CM

## 2022-06-19 DIAGNOSIS — Z3480 Encounter for supervision of other normal pregnancy, unspecified trimester: Secondary | ICD-10-CM

## 2022-06-19 NOTE — Progress Notes (Signed)
   PRENATAL VISIT NOTE  Subjective:  Karina [name] is a 26 y.o. G3P1102 at [redacted]w[redacted]d being seen today for ongoing prenatal care.  She is currently monitored for the following issues for this high-risk pregnancy and has History of preterm delivery, currently pregnant; Supervision of other normal pregnancy, antepartum; History of cholestasis during pregnancy; and Abnormal genetic test during pregnancy on their problem list.  Patient reports no complaints.  Contractions: Not present. Vag. Bleeding: None.  Movement: Present. Denies leaking of fluid.   The following portions of the patient's history were reviewed and updated as appropriate: allergies, current medications, past family history, past medical history, past social history, past surgical history and problem list.   Objective:   Vitals:   06/19/22 0847  BP: 101/67  Pulse: 90  Weight: 104 lb 6.4 oz (47.4 kg)    Fetal Status: Fetal Heart Rate (bpm): 150 Fundal Height: 28 cm Movement: Present     General:  Alert, oriented and cooperative. Patient is in no acute distress.  Skin: Skin is warm and dry. No rash noted.   Cardiovascular: Normal heart rate noted  Respiratory: Normal respiratory effort, no problems with respiration noted  Abdomen: Soft, gravid, appropriate for gestational age.  Pain/Pressure: Absent     Pelvic: Cervical exam deferred        Extremities: Normal range of motion.  Edema: None  Mental Status: Normal mood and affect. Normal behavior. Normal judgment and thought content.   Assessment and Plan:  Pregnancy: G3P1102 at [redacted]w[redacted]d 1. Supervision of other normal pregnancy, antepartum - Glucose Tolerance, 2 Hours w/1 Hour - HIV antibody (with reflex) - RPR - CBC - Tdap vaccine greater than or equal to 7yo IM  2. Abnormal genetic test during pregnancy - Panorama suggestive of XYY  3. History of preterm delivery, currently pregnant - At 35 weeks with first pregnancy  - Normal fetal growth and cervical length on  [place] 6/29 discussed with patient, follow-up with MFM scheduled 07/04/22  4. History of cholestasis during pregnancy  5. [redacted] weeks gestation of pregnancy  Preterm labor symptoms and general obstetric precautions including but not limited to vaginal bleeding, contractions, leaking of fluid and fetal movement were reviewed in detail with the patient. Please refer to After Visit Summary for other counseling recommendations.   Return in about 2 weeks (around 07/03/2022) for LOB, In-Person, any provider.  Future Appointments  Date Time Provider Department Center  06/19/2022  9:55 AM Marny Lowenstein, PA-C [facility] None  07/03/2022  2:30 PM Corlis Hove, NP [facility] None  07/04/2022 12:30 PM [facility] NURSE [facility]  07/04/2022 12:45 PM [facility] US4 [facility]    Vonzella Nipple, PA-C

## 2022-06-19 NOTE — Progress Notes (Signed)
Pt presents for ROB visit. Pt has no concerns at this time.  TDap given today.

## 2022-06-20 LAB — CBC
Hematocrit: 30.2 % — ABNORMAL LOW (ref 34.0–46.6)
Hemoglobin: 9.7 g/dL — ABNORMAL LOW (ref 11.1–15.9)
MCH: 25.9 pg — ABNORMAL LOW (ref 26.6–33.0)
MCHC: 32.1 g/dL (ref 31.5–35.7)
MCV: 81 fL (ref 79–97)
Platelets: 201 10*3/uL (ref 150–450)
RBC: 3.74 x10E6/uL — ABNORMAL LOW (ref 3.77–5.28)
RDW: 12.9 % (ref 11.7–15.4)
WBC: 7.4 10*3/uL (ref 3.4–10.8)

## 2022-06-20 LAB — GLUCOSE TOLERANCE, 2 HOURS W/ 1HR
Glucose, 1 hour: 83 mg/dL (ref 70–179)
Glucose, 2 hour: 88 mg/dL (ref 70–152)
Glucose, Fasting: 69 mg/dL — ABNORMAL LOW (ref 70–91)

## 2022-06-20 LAB — HIV ANTIBODY (ROUTINE TESTING W REFLEX): HIV Screen 4th Generation wRfx: NONREACTIVE

## 2022-06-20 LAB — RPR: RPR Ser Ql: NONREACTIVE

## 2022-06-20 MED ORDER — FERROUS SULFATE 324 (65 FE) MG PO TBEC: 1.0000 | DELAYED_RELEASE_TABLET | ORAL | 11 refills | Status: DC

## 2022-06-20 NOTE — Addendum Note (Signed)
Addended by: Marny Lowenstein on: 06/20/2022 09:10 AM   Modules accepted: Orders

## 2022-07-03 ENCOUNTER — Encounter: Payer: Self-pay | Admitting: Student

## 2022-07-03 ENCOUNTER — Ambulatory Visit (INDEPENDENT_AMBULATORY_CARE_PROVIDER_SITE_OTHER): Payer: Medicaid Other | Admitting: Student

## 2022-07-03 VITALS — BP 108/61 | HR 91 | Wt 105.0 lb

## 2022-07-03 DIAGNOSIS — O285 Abnormal chromosomal and genetic finding on antenatal screening of mother: Secondary | ICD-10-CM

## 2022-07-03 DIAGNOSIS — O09893 Supervision of other high risk pregnancies, third trimester: Secondary | ICD-10-CM

## 2022-07-03 DIAGNOSIS — O99013 Anemia complicating pregnancy, third trimester: Secondary | ICD-10-CM

## 2022-07-03 DIAGNOSIS — Z8759 Personal history of other complications of pregnancy, childbirth and the puerperium: Secondary | ICD-10-CM

## 2022-07-03 DIAGNOSIS — Z8719 Personal history of other diseases of the digestive system: Secondary | ICD-10-CM

## 2022-07-03 DIAGNOSIS — Z3480 Encounter for supervision of other normal pregnancy, unspecified trimester: Secondary | ICD-10-CM

## 2022-07-03 DIAGNOSIS — Z3A29 29 weeks gestation of pregnancy: Secondary | ICD-10-CM

## 2022-07-03 DIAGNOSIS — O09899 Supervision of other high risk pregnancies, unspecified trimester: Secondary | ICD-10-CM

## 2022-07-03 DIAGNOSIS — Z3483 Encounter for supervision of other normal pregnancy, third trimester: Secondary | ICD-10-CM

## 2022-07-03 NOTE — Progress Notes (Signed)
   PRENATAL VISIT NOTE  Subjective:  Karina [name] is a 26 y.o. G3P1102 at [redacted]w[redacted]d being seen today for ongoing prenatal care.  She is currently monitored for the following issues for this low-risk pregnancy and has History of preterm delivery, currently pregnant; Supervision of other normal pregnancy, antepartum; History of cholestasis during pregnancy; and Abnormal genetic test during pregnancy on their problem list.  Patient reports no complaints.  Contractions: Not present. Vag. Bleeding: None.  Movement: Present. Denies leaking of fluid.   The following portions of the patient's history were reviewed and updated as appropriate: allergies, current medications, past family history, past medical history, past social history, past surgical history and problem list.   Objective:   Vitals:   07/03/22 1440  BP: 108/61  Pulse: 91  Weight: 105 lb (47.6 kg)    Fetal Status: Fetal Heart Rate (bpm): 129 Fundal Height: 29 cm Movement: Present     General:  Alert, oriented and cooperative. Patient is in no acute distress.  Skin: Skin is warm and dry. No rash noted.   Cardiovascular: Normal heart rate noted  Respiratory: Normal respiratory effort, no problems with respiration noted  Abdomen: Soft, gravid, appropriate for gestational age.  Pain/Pressure: Absent     Pelvic: Cervical exam deferred        Extremities: Normal range of motion.  Edema: None  Mental Status: Normal mood and affect. Normal behavior. Normal judgment and thought content.   Assessment and Plan:  Pregnancy: G3P1102 at [redacted]w[redacted]d 1. Supervision of other normal pregnancy, antepartum - Reports frequent and vigorous fetal movement  2. [redacted] weeks gestation of pregnancy - Reviewed 3rd trimester lab results  3. Abnormal genetic test during pregnancy -Abnormal Panorama: Results suggestive of XYY   4. History of preterm delivery, currently pregnant - F/U US tomorrow - No signs of early labor  5. History of cholestasis  during pregnancy - normal Bile acids and Liver enzymes on 06/28 - Patient is aware of s/s to report to healthcare provider  6. Anemia during pregnancy in third trimester - Taking PO iron  Preterm labor symptoms and general obstetric precautions including but not limited to vaginal bleeding, contractions, leaking of fluid and fetal movement were reviewed in detail with the patient. Please refer to After Visit Summary for other counseling recommendations.   Return in about 2 weeks (around 07/17/2022) for LOB, IN-PERSON.  Future Appointments  Date Time Provider Department Center  07/04/2022 12:30 PM [facility] NURSE [facility]  07/04/2022 12:45 PM [facility] US4 [facility]  07/17/2022  1:30 PM Leftwich-Kirby, Wilmer Floor, CNM [facility] None  07/31/2022  1:30 PM Corlis Hove, NP [facility] None  08/14/2022  1:30 PM Gerrit Heck, CNM [facility] None  08/21/2022  1:30 PM Leftwich-Kirby, Wilmer Floor, CNM [facility] None  08/28/2022  1:30 PM Carlynn Herald, CNM [facility] None  09/04/2022  1:30 PM Gerrit Heck, CNM [facility] None    Corlis Hove, NP

## 2022-07-04 ENCOUNTER — Ambulatory Visit: Payer: Medicaid Other | Attending: Obstetrics and Gynecology

## 2022-07-04 ENCOUNTER — Other Ambulatory Visit: Payer: Self-pay | Admitting: *Deleted

## 2022-07-04 ENCOUNTER — Ambulatory Visit: Payer: Medicaid Other | Admitting: *Deleted

## 2022-07-04 VITALS — BP 99/58 | HR 104

## 2022-07-04 DIAGNOSIS — O09293 Supervision of pregnancy with other poor reproductive or obstetric history, third trimester: Secondary | ICD-10-CM | POA: Diagnosis not present

## 2022-07-04 DIAGNOSIS — O280 Abnormal hematological finding on antenatal screening of mother: Secondary | ICD-10-CM

## 2022-07-04 DIAGNOSIS — Z3A30 30 weeks gestation of pregnancy: Secondary | ICD-10-CM | POA: Diagnosis not present

## 2022-07-04 DIAGNOSIS — O3515X0 Maternal care for (suspected) chromosomal abnormality in fetus, sex chromosome abnormality, not applicable or unspecified: Secondary | ICD-10-CM

## 2022-07-04 DIAGNOSIS — O09213 Supervision of pregnancy with history of pre-term labor, third trimester: Secondary | ICD-10-CM | POA: Diagnosis not present

## 2022-07-17 ENCOUNTER — Encounter: Payer: Self-pay | Admitting: Advanced Practice Midwife

## 2022-07-17 ENCOUNTER — Ambulatory Visit (INDEPENDENT_AMBULATORY_CARE_PROVIDER_SITE_OTHER): Payer: Medicaid Other | Admitting: Advanced Practice Midwife

## 2022-07-17 VITALS — BP 104/67 | HR 99 | Wt 106.3 lb

## 2022-07-17 DIAGNOSIS — O09899 Supervision of other high risk pregnancies, unspecified trimester: Secondary | ICD-10-CM

## 2022-07-17 DIAGNOSIS — L239 Allergic contact dermatitis, unspecified cause: Secondary | ICD-10-CM

## 2022-07-17 DIAGNOSIS — O09893 Supervision of other high risk pregnancies, third trimester: Secondary | ICD-10-CM

## 2022-07-17 DIAGNOSIS — O0993 Supervision of high risk pregnancy, unspecified, third trimester: Secondary | ICD-10-CM

## 2022-07-17 DIAGNOSIS — O285 Abnormal chromosomal and genetic finding on antenatal screening of mother: Secondary | ICD-10-CM

## 2022-07-17 DIAGNOSIS — L299 Pruritus, unspecified: Secondary | ICD-10-CM

## 2022-07-17 DIAGNOSIS — Z3A31 31 weeks gestation of pregnancy: Secondary | ICD-10-CM

## 2022-07-17 NOTE — Progress Notes (Unsigned)
   PRENATAL VISIT NOTE  Subjective:  Karina [name] is a 26 y.o. G3P1102 at [redacted]w[redacted]d being seen today for ongoing prenatal care.  She is currently monitored for the following issues for this {Blank single:19197::"high-risk","low-risk"} pregnancy and has History of preterm delivery, currently pregnant; Supervision of other normal pregnancy, antepartum; History of cholestasis during pregnancy; and Abnormal genetic test during pregnancy on their problem list.  Patient reports {sx:14538}.  Contractions: Irritability. Vag. Bleeding: None.  Movement: Present. Denies leaking of fluid.   The following portions of the patient's history were reviewed and updated as appropriate: allergies, current medications, past family history, past medical history, past social history, past surgical history and problem list.   Objective:   Vitals:   07/17/22 1340  BP: 104/67  Pulse: 99  Weight: 106 lb 4.8 oz (48.2 kg)    Fetal Status: Fetal Heart Rate (bpm): 141   Movement: Present     General:  Alert, oriented and cooperative. Patient is in no acute distress.  Skin: Skin is warm and dry. No rash noted.   Cardiovascular: Normal heart rate noted  Respiratory: Normal respiratory effort, no problems with respiration noted  Abdomen: Soft, gravid, appropriate for gestational age.  Pain/Pressure: Absent     Pelvic: {Blank single:19197::"Cervical exam performed in the presence of a chaperone","Cervical exam deferred"}        Extremities: Normal range of motion.  Edema: None  Mental Status: Normal mood and affect. Normal behavior. Normal judgment and thought content.   Assessment and Plan:  Pregnancy: G3P1102 at [redacted]w[redacted]d 1. Abnormal genetic test during pregnancy --NIPS suggestive of XYY  2. Supervision of high risk pregnancy in third trimester --Anticipatory guidance about next visits/weeks of pregnancy given.   3. History of preterm delivery, currently pregnant   4. [redacted] weeks gestation of pregnancy    {Blank single:19197::"Term","Preterm"} labor symptoms and general obstetric precautions including but not limited to vaginal bleeding, contractions, leaking of fluid and fetal movement were reviewed in detail with the patient. Please refer to After Visit Summary for other counseling recommendations.   No follow-ups on file.  Future Appointments  Date Time Provider Department Center  07/31/2022  1:30 PM Corlis Hove, NP [facility] None  08/14/2022  1:30 PM Gerrit Heck, CNM [facility] None  08/15/2022  1:15 PM [facility] NURSE [facility]  08/15/2022  1:30 PM [facility] US2 [facility]  08/21/2022  1:30 PM Leftwich-Kirby, Wilmer Floor, CNM [facility] None  08/28/2022  1:30 PM Carlynn Herald, CNM [facility] None  09/04/2022  1:30 PM Gerrit Heck, CNM [facility] None    Sharen Counter, CNM

## 2022-07-17 NOTE — Progress Notes (Signed)
Patient presents for routine prenatal visit. No concerns at this time.  

## 2022-07-19 LAB — BILE ACIDS, TOTAL: Bile Acids Total: 7.5 umol/L (ref 0.0–10.0)

## 2022-07-19 LAB — COMPREHENSIVE METABOLIC PANEL
ALT: 18 IU/L (ref 0–32)
AST: 23 IU/L (ref 0–40)
Albumin/Globulin Ratio: 1.5 (ref 1.2–2.2)
Albumin: 3.5 g/dL — ABNORMAL LOW (ref 4.0–5.0)
Alkaline Phosphatase: 113 IU/L (ref 44–121)
BUN/Creatinine Ratio: 22 (ref 9–23)
BUN: 8 mg/dL (ref 6–20)
Bilirubin Total: 0.3 mg/dL (ref 0.0–1.2)
CO2: 21 mmol/L (ref 20–29)
Calcium: 8.5 mg/dL — ABNORMAL LOW (ref 8.7–10.2)
Chloride: 104 mmol/L (ref 96–106)
Creatinine, Ser: 0.36 mg/dL — ABNORMAL LOW (ref 0.57–1.00)
Globulin, Total: 2.4 g/dL (ref 1.5–4.5)
Glucose: 97 mg/dL (ref 70–99)
Potassium: 3.7 mmol/L (ref 3.5–5.2)
Sodium: 140 mmol/L (ref 134–144)
Total Protein: 5.9 g/dL — ABNORMAL LOW (ref 6.0–8.5)
eGFR: 143 mL/min/{1.73_m2} (ref >59)

## 2022-07-25 ENCOUNTER — Inpatient Hospital Stay (HOSPITAL_COMMUNITY)
Admission: AD | Admit: 2022-07-25 | Discharge: 2022-07-25 | Disposition: A | Discharge status: Home / Self Care | Payer: Medicaid Other | Attending: Obstetrics and Gynecology | Admitting: Obstetrics and Gynecology

## 2022-07-25 ENCOUNTER — Inpatient Hospital Stay (HOSPITAL_COMMUNITY): Payer: Medicaid Other

## 2022-07-25 ENCOUNTER — Encounter (HOSPITAL_COMMUNITY): Payer: Self-pay | Admitting: Obstetrics and Gynecology

## 2022-07-25 DIAGNOSIS — O26613 Liver and biliary tract disorders in pregnancy, third trimester: Secondary | ICD-10-CM | POA: Insufficient documentation

## 2022-07-25 DIAGNOSIS — K810 Acute cholecystitis: Secondary | ICD-10-CM | POA: Diagnosis not present

## 2022-07-25 DIAGNOSIS — Z8719 Personal history of other diseases of the digestive system: Secondary | ICD-10-CM

## 2022-07-25 DIAGNOSIS — Z3A33 33 weeks gestation of pregnancy: Secondary | ICD-10-CM | POA: Insufficient documentation

## 2022-07-25 DIAGNOSIS — K8050 Calculus of bile duct without cholangitis or cholecystitis without obstruction: Secondary | ICD-10-CM

## 2022-07-25 DIAGNOSIS — K8010 Calculus of gallbladder with chronic cholecystitis without obstruction: Secondary | ICD-10-CM | POA: Insufficient documentation

## 2022-07-25 DIAGNOSIS — K8070 Calculus of gallbladder and bile duct without cholecystitis without obstruction: Secondary | ICD-10-CM | POA: Diagnosis not present

## 2022-07-25 DIAGNOSIS — O09213 Supervision of pregnancy with history of pre-term labor, third trimester: Secondary | ICD-10-CM | POA: Diagnosis not present

## 2022-07-25 DIAGNOSIS — O4700 False labor before 37 completed weeks of gestation, unspecified trimester: Secondary | ICD-10-CM | POA: Diagnosis not present

## 2022-07-25 DIAGNOSIS — K8020 Calculus of gallbladder without cholecystitis without obstruction: Secondary | ICD-10-CM

## 2022-07-25 DIAGNOSIS — O26893 Other specified pregnancy related conditions, third trimester: Secondary | ICD-10-CM | POA: Diagnosis present

## 2022-07-25 DIAGNOSIS — O99613 Diseases of the digestive system complicating pregnancy, third trimester: Secondary | ICD-10-CM

## 2022-07-25 DIAGNOSIS — R1011 Right upper quadrant pain: Secondary | ICD-10-CM

## 2022-07-25 LAB — URINALYSIS, ROUTINE W REFLEX MICROSCOPIC
Bilirubin Urine: NEGATIVE
Glucose, UA: NEGATIVE mg/dL
Hgb urine dipstick: NEGATIVE
Ketones, ur: 20 mg/dL — AB
Leukocytes,Ua: NEGATIVE
Nitrite: NEGATIVE
Protein, ur: NEGATIVE mg/dL
Specific Gravity, Urine: 1.008 (ref 1.005–1.030)
pH: 7 (ref 5.0–8.0)

## 2022-07-25 LAB — CBC
HCT: 30.8 % — ABNORMAL LOW (ref 36.0–46.0)
Hemoglobin: 9.7 g/dL — ABNORMAL LOW (ref 12.0–15.0)
MCH: 24 pg — ABNORMAL LOW (ref 26.0–34.0)
MCHC: 31.5 g/dL (ref 30.0–36.0)
MCV: 76.2 fL — ABNORMAL LOW (ref 80.0–100.0)
Platelets: 179 10*3/uL (ref 150–400)
RBC: 4.04 MIL/uL (ref 3.87–5.11)
RDW: 14.4 % (ref 11.5–15.5)
WBC: 9.5 10*3/uL (ref 4.0–10.5)
nRBC: 0 % (ref 0.0–0.2)

## 2022-07-25 LAB — TYPE AND SCREEN
ABO/RH(D): A POS
Antibody Screen: NEGATIVE

## 2022-07-25 LAB — COMPREHENSIVE METABOLIC PANEL
ALT: 53 U/L — ABNORMAL HIGH (ref 0–44)
AST: 43 U/L — ABNORMAL HIGH (ref 15–41)
Albumin: 2.6 g/dL — ABNORMAL LOW (ref 3.5–5.0)
Alkaline Phosphatase: 124 U/L (ref 38–126)
Anion gap: 11 (ref 5–15)
BUN: 6 mg/dL (ref 6–20)
CO2: 21 mmol/L — ABNORMAL LOW (ref 22–32)
Calcium: 8.3 mg/dL — ABNORMAL LOW (ref 8.9–10.3)
Chloride: 108 mmol/L (ref 98–111)
Creatinine, Ser: 0.47 mg/dL (ref 0.44–1.00)
GFR, Estimated: >60 mL/min (ref >60)
Glucose, Bld: 84 mg/dL (ref 70–99)
Potassium: 3.6 mmol/L (ref 3.5–5.1)
Sodium: 140 mmol/L (ref 135–145)
Total Bilirubin: 0.5 mg/dL (ref 0.3–1.2)
Total Protein: 5.9 g/dL — ABNORMAL LOW (ref 6.5–8.1)

## 2022-07-25 LAB — AMYLASE: Amylase: 73 U/L (ref 28–100)

## 2022-07-25 LAB — LIPASE, BLOOD: Lipase: 36 U/L (ref 11–51)

## 2022-07-25 MED ORDER — LACTATED RINGERS IV BOLUS
1000.0000 mL | Freq: Once | INTRAVENOUS | Status: AC
  Administered 2022-07-25: 1000 mL via INTRAVENOUS

## 2022-07-25 MED ORDER — SODIUM CHLORIDE 0.9 % IV SOLN
12.5000 mg | Freq: Four times a day (QID) | INTRAVENOUS | Status: DC | PRN
  Filled 2022-07-25: qty 0.5

## 2022-07-25 MED ORDER — ONDANSETRON HCL 4 MG/2ML IJ SOLN
4.0000 mg | Freq: Four times a day (QID) | INTRAMUSCULAR | Status: DC | PRN
  Administered 2022-07-25 (×2): 4 mg via INTRAVENOUS
  Filled 2022-07-25 (×2): qty 2

## 2022-07-25 MED ORDER — NIFEDIPINE 10 MG PO CAPS
10.0000 mg | ORAL_CAPSULE | ORAL | Status: DC | PRN
  Administered 2022-07-25 (×3): 10 mg via ORAL
  Filled 2022-07-25 (×3): qty 1

## 2022-07-25 MED ORDER — HYDROMORPHONE HCL 1 MG/ML IJ SOLN
1.0000 mg | INTRAMUSCULAR | Status: DC | PRN
  Administered 2022-07-25 (×2): 1 mg via INTRAVENOUS
  Filled 2022-07-25 (×2): qty 1

## 2022-07-25 MED ORDER — PIPERACILLIN-TAZOBACTAM 3.375 G IVPB
3.3750 g | Freq: Three times a day (TID) | INTRAVENOUS | Status: DC
  Administered 2022-07-25: 3.375 g via INTRAVENOUS
  Filled 2022-07-25: qty 50

## 2022-07-25 MED ORDER — OXYCODONE HCL 5 MG PO TABS: 5.0000 mg | ORAL_TABLET | ORAL | 0 refills | Status: DC | PRN

## 2022-07-25 MED ORDER — LACTATED RINGERS IV SOLN: INTRAVENOUS | Status: DC

## 2022-07-25 MED ORDER — SODIUM CHLORIDE 0.9 % IV SOLN
2.0000 g | Freq: Once | INTRAVENOUS | Status: DC
  Filled 2022-07-25: qty 20

## 2022-07-25 MED ORDER — ZOLPIDEM TARTRATE 5 MG PO TABS: 5.0000 mg | ORAL_TABLET | Freq: Every evening | ORAL | Status: DC | PRN

## 2022-07-25 MED ORDER — ONDANSETRON 4 MG PO TBDP: 4.0000 mg | ORAL_TABLET | Freq: Four times a day (QID) | ORAL | 0 refills | Status: DC | PRN

## 2022-07-25 MED ORDER — ACETAMINOPHEN 325 MG PO TABS: 650.0000 mg | ORAL_TABLET | ORAL | Status: DC | PRN

## 2022-07-25 MED ORDER — CALCIUM CARBONATE ANTACID 500 MG PO CHEW: 2.0000 | CHEWABLE_TABLET | ORAL | Status: DC | PRN

## 2022-07-25 MED ORDER — DOCUSATE SODIUM 100 MG PO CAPS: 100.0000 mg | ORAL_CAPSULE | Freq: Every day | ORAL | Status: DC

## 2022-07-25 MED ORDER — LACTATED RINGERS IV SOLN: Freq: Once | INTRAVENOUS | Status: AC

## 2022-07-25 MED ORDER — PRENATAL MULTIVITAMIN CH: 1.0000 | ORAL_TABLET | Freq: Every day | ORAL | Status: DC

## 2022-07-25 NOTE — MAU Provider Note (Addendum)
Chief Complaint:  Abdominal Cramping   Event Date/Time   First Provider Initiated Contact with Patient 07/25/22 0203     HPI: Karina [name] is a 26 y.o. G3P1102 at 49w0dwho presents to maternity admissions reporting pain in right upper quadrant since yesterday.  It came and went but this last episode did not improve. Had nausea a couple of days ago but none now. . She reports good fetal movement, denies LOF, vaginal bleeding, urinary symptoms, h/a, dizziness, n/v, diarrhea, constipation or fever/chills.    Abdominal Cramping This is a new problem. The current episode started yesterday. The onset quality is gradual. The problem occurs intermittently. The problem has been unchanged. The pain is located in the RUQ. The pain is moderate. The quality of the pain is aching and cramping. Associated symptoms include nausea. Pertinent negatives include no constipation, diarrhea, dysuria, fever, frequency, headaches, myalgias or vomiting. The pain is aggravated by palpation. The pain is relieved by Nothing. She has tried nothing for the symptoms.   RN Note: Karina [name] is a 26 y.o. at [redacted]w[redacted]d here in MAU reporting: Pt states that last night she felt right upper abdominal cramping pain, pt took a shower and it went away. Tonight around 8pm  or 9pm she felt the same pain, took a shower and it went away again. Pt states that cramping pain came back around 2300  tonight and has not gone away. No LOF, No bleeding +FM.    Past Medical History: Past Medical History:  Diagnosis Date   Cholestasis during pregnancy    Cholestasis during pregnancy in third trimester 03/21/2021   History of preterm delivery 11/09/2020   G1, PTL and delivery at [redacted]w[redacted]d   Preterm labor    SVD (spontaneous vaginal delivery) 03/21/2021    Past obstetric history: OB History  Gravida Para Term Preterm AB Living  3 2 1 1   2   SAB IAB Ectopic Multiple Live Births        0 2    # Outcome Date GA Lbr Len/2nd Weight Sex  Delivery Anes PTL Lv  3 Current           2 Term 03/21/21 [redacted]w[redacted]d 01:53 / 00:22 2960 g F Vag-Spont EPI  LIV  1 Preterm 01/15/15 [redacted]w[redacted]d 05:18 / 02:59 2250 g F Vag-Spont EPI Y LIV    Past Surgical History: Past Surgical History:  Procedure Laterality Date   NO PAST SURGERIES      Family History: Family History  Problem Relation Age of Onset   Healthy Mother    Hyperlipidemia Father    Diabetes Father     Social History: Social History   Tobacco Use   Smoking status: Never   Smokeless tobacco: Never  Vaping Use   Vaping Use: Never used  Substance Use Topics   Alcohol use: No   Drug use: No    Types: Marijuana    Comment: prior to first preg    Allergies: No Known Allergies  Meds:  Medications Prior to Admission  Medication Sig Dispense Refill Last Dose   ferrous sulfate 324 (65 Fe) MG TBEC Take 1 tablet (325 mg total) by mouth every other day. 30 tablet 11    PRENATAL 28-0.8 MG TABS Take 1 tablet by mouth daily. 30 each 12     I have reviewed patient's Past Medical Hx, Surgical Hx, Family Hx, Social Hx, medications and allergies.   ROS:  Review of Systems  Constitutional:  Negative for fever.  Gastrointestinal:  Positive for nausea. Negative for constipation, diarrhea and vomiting.  Genitourinary:  Negative for dysuria and frequency.  Musculoskeletal:  Negative for myalgias.  Neurological:  Negative for headaches.   Other systems negative  Physical Exam  Patient Vitals for the past 24 hrs:  BP Temp Temp src Pulse Resp SpO2 Height Weight  07/25/22 0155 127/69 98.1 F (36.7 C) Oral 97 17 99 % 5\' 1"  (1.549 m) 48.4 kg   Constitutional: Well-developed, well-nourished female in no acute distress.  Cardiovascular: normal rate and rhythm Respiratory: normal effort, clear to auscultation bilaterally GI: Abd soft, non-tender, gravid appropriate for gestational age.   No rebound or guarding. MS: Extremities nontender, no edema, normal ROM Neurologic: Alert and  oriented x 4.  GU: Neg CVAT.  PELVIC EXAM: Cervix pink, visually closed, without lesion, scant white creamy discharge, vaginal walls and external genitalia normal Bimanual exam: Cervix firm, posterior, neg CMT, uterus nontender, Fundal Height consistent with dates, adnexa without tenderness, enlargement, or mass  FHT:  Baseline 130 , moderate variability, accelerations present, no decelerations Contractions: q 3-4 mins, felt only as tightening   Labs: Results for orders placed or performed during the hospital encounter of 07/25/22 (from the past 24 hour(s))  Urinalysis, Routine w reflex microscopic Urine, Clean Catch     Status: Abnormal   Collection Time: 07/25/22  2:28 AM  Result Value Ref Range   Color, Urine YELLOW YELLOW   APPearance CLEAR CLEAR   Specific Gravity, Urine 1.008 1.005 - 1.030   pH 7.0 5.0 - 8.0   Glucose, UA NEGATIVE NEGATIVE mg/dL   Hgb urine dipstick NEGATIVE NEGATIVE   Bilirubin Urine NEGATIVE NEGATIVE   Ketones, ur 20 (A) NEGATIVE mg/dL   Protein, ur NEGATIVE NEGATIVE mg/dL   Nitrite NEGATIVE NEGATIVE   Leukocytes,Ua NEGATIVE NEGATIVE  CBC     Status: Abnormal   Collection Time: 07/25/22  2:38 AM  Result Value Ref Range   WBC 9.5 4.0 - 10.5 K/uL   RBC 4.04 3.87 - 5.11 MIL/uL   Hemoglobin 9.7 (L) 12.0 - 15.0 g/dL   HCT 07/27/22 (L) 28.7 - 86.7 %   MCV 76.2 (L) 80.0 - 100.0 fL   MCH 24.0 (L) 26.0 - 34.0 pg   MCHC 31.5 30.0 - 36.0 g/dL   RDW 67.2 09.4 - 70.9 %   Platelets 179 150 - 400 K/uL   nRBC 0.0 0.0 - 0.2 %  Comprehensive metabolic panel     Status: Abnormal   Collection Time: 07/25/22  2:38 AM  Result Value Ref Range   Sodium 140 135 - 145 mmol/L   Potassium 3.6 3.5 - 5.1 mmol/L   Chloride 108 98 - 111 mmol/L   CO2 21 (L) 22 - 32 mmol/L   Glucose, Bld 84 70 - 99 mg/dL   BUN 6 6 - 20 mg/dL   Creatinine, Ser 07/27/22 0.44 - 1.00 mg/dL   Calcium 8.3 (L) 8.9 - 10.3 mg/dL   Total Protein 5.9 (L) 6.5 - 8.1 g/dL   Albumin 2.6 (L) 3.5 - 5.0 g/dL   AST 43  (H) 15 - 41 U/L   ALT 53 (H) 0 - 44 U/L   Alkaline Phosphatase 124 38 - 126 U/L   Total Bilirubin 0.5 0.3 - 1.2 mg/dL   GFR, Estimated 3.66 >29 mL/min   Anion gap 11 5 - 15  Lipase, blood     Status: None   Collection Time: 07/25/22  2:38 AM  Result Value Ref Range  Lipase 36 11 - 51 U/L  Amylase     Status: None   Collection Time: 07/25/22  2:38 AM  Result Value Ref Range   Amylase 73 28 - 100 U/L    A/Positive/-- (04/27 1191)  Imaging:  US ABDOMEN LIMITED RUQ (LIVER/GB)  Result Date: 07/25/2022 CLINICAL DATA:  Right upper quadrant pain EXAM: ULTRASOUND ABDOMEN LIMITED RIGHT UPPER QUADRANT COMPARISON:  None Available. FINDINGS: Gallbladder: There are multiple mobile, shadowing gallstones. A positive sonographic Eulah Pont sign was reported by the sonographer. No gallbladder wall thickening or pericholecystic fluid. Common bile duct: Diameter: 2 mm Liver: No focal lesion identified. Within normal limits in parenchymal echogenicity. Portal vein is patent on color Doppler imaging with normal direction of blood flow towards the liver. Other: None. IMPRESSION: Cholelithiasis with a positive sonographic Murphy sign, which is consistent with acute cholecystitis in the appropriate context Electronically Signed   By: Deatra Robinson M.D.   On: 07/25/2022 03:30    MAU Course/MDM: I have reviewed the triage vital signs and the nursing notes.   Pertinent labs & imaging results that were available during my care of the patient were reviewed by me and considered in my medical decision making (see chart for details).      I have reviewed her medical records including past results, notes and treatments.   I have ordered labs and reviewed results. There are slight elevations in Transaminases No leukocytosis or elevation in Amylase or Lipase.  US shows cholelithiasis with cholecystitis (sonographic Murphy sign) NST reviewed, reassuring for gestational age with frequent painless contractions Consult Dr Donavan Foil  with presentation, exam findings and test results.  Treatments in MAU included IV fluids, analgesia, antiemetic, Procardia for tocolysis Dr Donavan Foil will call Surgery in AM for surgical consult.    Assessment: SIngle IUP at [redacted]w[redacted]d Cholelithiasis with Cholecystitis History of Cholestasis last year Preterm contractions, not felt as painful, but lessened with tocolysis  Plan: Admit to Antenatal (decision made to hold in MAU until surgery consult in AM due to staffing NPO Analgesia and antiemetics prn General Surgery Consult MD to follow  Wynelle Bourgeois CNM, MSN Certified Nurse-Midwife 07/25/2022 2:03 AM    Addendum Evaluated by gen surg, no suspicion for cholecystitis, more likely biliary colic. Recommend low fat diet and outpatient follow up, surgery not indicated at present. I discussed this with patient and she is amenable with outpatient management. Strongly emphasized importance of adherence to low fat diet to avoid recurrent symptoms. Reviewed warning signs of fever, worsening pain, inability to take PO.  Discharged to home in stable condition.   Venora Maples, MD/MPH Attending Family Medicine Physician, [facility] for [facility], [facility]  12:09 PM 07/25/22

## 2022-07-25 NOTE — MAU Note (Signed)
.  Karina [name] is a 26 y.o. at [redacted]w[redacted]d here in MAU reporting: Pt states that last night she felt right upper abdominal cramping pain, pt took a shower and it went away. Tonight around 8pm  or 9pm she felt the same pain, took a shower and it went away again. Pt states that cramping pain came back around 2300  tonight and has not gone away. No LOF, No bleeding +FM.    Pain score: 8/10 Right upper Abdominal Pain.  Vitals:   07/25/22 0155  BP: 127/69  Pulse: 97  Resp: 17  Temp: 98.1 F (36.7 C)  SpO2: 99%     FHT:140 Lab orders placed from triage. UA.

## 2022-07-25 NOTE — H&P (Signed)
Karina [name] is an 26 y.o. female.   Chief Complaint: HPI: Patient is a 26 year old female, who is 31 weeks 6 days pregnant. Patient comes in secondary to abdominal pain.  States this began approximate 2 days ago.  She states this was after dinner.  States that she took a bath and pain resolved.  Patient had pain again overnight.  She states that she had unrelenting pain so she came in.  She states that she had previous nausea vomiting however this was not related to the pain.  She does state that she had a high fatty meal but was likely prior to the pain.  Patient did undergo ultrasound.  This was significant for gallstones however no signs of cholecystitis.  Patient does state at this time the pain is resolved.    Past Medical History:  Diagnosis Date   Cholestasis during pregnancy    Cholestasis during pregnancy in third trimester 03/21/2021   History of preterm delivery 11/09/2020   G1, PTL and delivery at [redacted]w[redacted]d   Preterm labor    SVD (spontaneous vaginal delivery) 03/21/2021    Past Surgical History:  Procedure Laterality Date   NO PAST SURGERIES      Family History  Problem Relation Age of Onset   Healthy Mother    Hyperlipidemia Father    Diabetes Father    Social History:  reports that she has never smoked. She has never used smokeless tobacco. She reports that she does not drink alcohol and does not use drugs.  Allergies: No Known Allergies  Medications Prior to Admission  Medication Sig Dispense Refill   ferrous sulfate 324 (65 Fe) MG TBEC Take 1 tablet (325 mg total) by mouth every other day. 30 tablet 11   PRENATAL 28-0.8 MG TABS Take 1 tablet by mouth daily. 30 each 12    Results for orders placed or performed during the hospital encounter of 07/25/22 (from the past 48 hour(s))  Urinalysis, Routine w reflex microscopic Urine, Clean Catch     Status: Abnormal   Collection Time: 07/25/22  2:28 AM  Result Value Ref Range   Color, Urine YELLOW YELLOW    APPearance CLEAR CLEAR   Specific Gravity, Urine 1.008 1.005 - 1.030   pH 7.0 5.0 - 8.0   Glucose, UA NEGATIVE NEGATIVE mg/dL   Hgb urine dipstick NEGATIVE NEGATIVE   Bilirubin Urine NEGATIVE NEGATIVE   Ketones, ur 20 (A) NEGATIVE mg/dL   Protein, ur NEGATIVE NEGATIVE mg/dL   Nitrite NEGATIVE NEGATIVE   Leukocytes,Ua NEGATIVE NEGATIVE    Comment: Performed at [facility], [address] [place], [place] [address]  CBC     Status: Abnormal   Collection Time: 07/25/22  2:38 AM  Result Value Ref Range   WBC 9.5 4.0 - 10.5 K/uL   RBC 4.04 3.87 - 5.11 MIL/uL   Hemoglobin 9.7 (L) 12.0 - 15.0 g/dL   HCT 51.7 (L) 61.6 - 07.3 %   MCV 76.2 (L) 80.0 - 100.0 fL   MCH 24.0 (L) 26.0 - 34.0 pg   MCHC 31.5 30.0 - 36.0 g/dL   RDW 71.0 62.6 - 94.8 %   Platelets 179 150 - 400 K/uL   nRBC 0.0 0.0 - 0.2 %    Comment: Performed at [facility], [address], [place], [place] [address]  Comprehensive metabolic panel     Status: Abnormal   Collection Time: 07/25/22  2:38 AM  Result Value Ref Range   Sodium  140 135 - 145 mmol/L   Potassium 3.6 3.5 - 5.1 mmol/L   Chloride 108 98 - 111 mmol/L   CO2 21 (L) 22 - 32 mmol/L   Glucose, Bld 84 70 - 99 mg/dL    Comment: Glucose reference range applies only to samples taken after fasting for at least 8 hours.   BUN 6 6 - 20 mg/dL   Creatinine, Ser 7.67 0.44 - 1.00 mg/dL   Calcium 8.3 (L) 8.9 - 10.3 mg/dL   Total Protein 5.9 (L) 6.5 - 8.1 g/dL   Albumin 2.6 (L) 3.5 - 5.0 g/dL   AST 43 (H) 15 - 41 U/L   ALT 53 (H) 0 - 44 U/L   Alkaline Phosphatase 124 38 - 126 U/L   Total Bilirubin 0.5 0.3 - 1.2 mg/dL   GFR, Estimated >34 >19 mL/min    Comment: (NOTE) Calculated using the CKD-EPI Creatinine Equation (2021)    Anion gap 11 5 - 15    Comment: Performed at [facility], [address], [place], [place] [address]  Lipase, blood     Status: None   Collection Time: 07/25/22  2:38 AM  Result Value Ref Range   Lipase 36 11 - 51 U/L     Comment: Performed at [facility], [address] [place], [place] [address]  Amylase     Status: None   Collection Time: 07/25/22  2:38 AM  Result Value Ref Range   Amylase 73 28 - 100 U/L    Comment: Performed at [facility], [address] [place], [place] [address]  Type and screen [facility]     Status: None   Collection Time: 07/25/22  6:07 AM  Result Value Ref Range   ABO/RH(D) A POS    Antibody Screen NEG    Sample Expiration      07/28/2022,2359 Performed at [facility], [address], [place], [place] [address]    US ABDOMEN LIMITED RUQ (LIVER/GB)  Result Date: 07/25/2022 CLINICAL DATA:  Right upper quadrant pain EXAM: ULTRASOUND ABDOMEN LIMITED RIGHT UPPER QUADRANT COMPARISON:  None Available. FINDINGS: Gallbladder: There are multiple mobile, shadowing gallstones. A positive sonographic Eulah Pont sign was reported by the sonographer. No gallbladder wall thickening or pericholecystic fluid. Common bile duct: Diameter: 2 mm Liver: No focal lesion identified. Within normal limits in parenchymal echogenicity. Portal vein is patent on color Doppler imaging with normal direction of blood flow towards the liver. Other: None. IMPRESSION: Cholelithiasis with a positive sonographic Murphy sign, which is consistent with acute cholecystitis in the appropriate context Electronically Signed   By: Deatra Robinson M.D.   On: 07/25/2022 03:30    Review of Systems  Constitutional: Negative.   HENT: Negative.    Respiratory: Negative.    Cardiovascular: Negative.   Gastrointestinal:  Positive for abdominal pain and nausea.  Neurological: Negative.   All other systems reviewed and are negative.   Blood pressure 108/66, pulse 94, temperature 98 F (36.7 C), temperature source Oral, resp. rate 18, height 5\' 1"  (1.549 m), weight 48.4 kg, last menstrual period 12/06/2021, SpO2 98 %, currently breastfeeding. Physical Exam Constitutional:      Appearance: She  is well-developed.     Comments: Conversant No acute distress  HENT:     Head: Normocephalic and atraumatic.  Eyes:     General: Lids are normal. No scleral icterus.    Pupils: Pupils are equal, round, and reactive to light.  Comments: Pupils are equal round and reactive No lid lag Moist conjunctiva  Neck:     Thyroid: No thyromegaly.     Trachea: No tracheal tenderness.     Comments: No cervical lymphadenopathy Cardiovascular:     Rate and Rhythm: Normal rate and regular rhythm.     Heart sounds: No murmur heard. Pulmonary:     Effort: Pulmonary effort is normal.     Breath sounds: Normal breath sounds. No wheezing or rales.  Abdominal:     Tenderness: There is no abdominal tenderness.     Hernia: No hernia is present.  Musculoskeletal:     Cervical back: Normal range of motion and neck supple.  Skin:    General: Skin is warm.     Findings: No rash.     Nails: There is no clubbing.     Comments: Normal skin turgor  Neurological:     Mental Status: She is alert and oriented to person, place, and time.     Comments: Normal gait and station  Psychiatric:        Mood and Affect: Mood normal.        Thought Content: Thought content normal.        Judgment: Judgment normal.     Comments: Appropriate affect      Assessment/Plan 26 year old female with likely biliary colic.    Patient is in her third trimester. I had a long discussion with the patient in regards to her pregnancy and her stones.  At this time would recommend symptomatic treatment.  We discussed staying off of a high fatty diet. We had patient can follow-up with [place] in clinic after delivery to discuss elective cholecystectomy.  Axel Filler, MD 07/25/2022, 11:10 AM

## 2022-07-31 ENCOUNTER — Ambulatory Visit (INDEPENDENT_AMBULATORY_CARE_PROVIDER_SITE_OTHER): Payer: Medicaid Other | Admitting: Student

## 2022-07-31 VITALS — BP 105/73 | HR 112 | Wt 104.4 lb

## 2022-07-31 DIAGNOSIS — O0993 Supervision of high risk pregnancy, unspecified, third trimester: Secondary | ICD-10-CM

## 2022-07-31 DIAGNOSIS — O285 Abnormal chromosomal and genetic finding on antenatal screening of mother: Secondary | ICD-10-CM

## 2022-07-31 DIAGNOSIS — K8050 Calculus of bile duct without cholangitis or cholecystitis without obstruction: Secondary | ICD-10-CM

## 2022-07-31 DIAGNOSIS — O09899 Supervision of other high risk pregnancies, unspecified trimester: Secondary | ICD-10-CM

## 2022-07-31 DIAGNOSIS — O99713 Diseases of the skin and subcutaneous tissue complicating pregnancy, third trimester: Secondary | ICD-10-CM

## 2022-07-31 DIAGNOSIS — L299 Pruritus, unspecified: Secondary | ICD-10-CM

## 2022-07-31 DIAGNOSIS — Z3A33 33 weeks gestation of pregnancy: Secondary | ICD-10-CM

## 2022-07-31 DIAGNOSIS — Z8759 Personal history of other complications of pregnancy, childbirth and the puerperium: Secondary | ICD-10-CM

## 2022-07-31 DIAGNOSIS — Z8719 Personal history of other diseases of the digestive system: Secondary | ICD-10-CM

## 2022-07-31 NOTE — Progress Notes (Signed)
Pt reports fetal movement with some pressure, advised of pregnancy belt. Pt reports not taking oxycodone prescribed in mau because she has not needed it.

## 2022-07-31 NOTE — Progress Notes (Addendum)
   PRENATAL VISIT NOTE  Subjective:  Karina [name] is a 26 y.o. G3P1102 at [redacted]w[redacted]d being seen today for ongoing prenatal care.  She is currently monitored for the following issues for this high-risk pregnancy and has History of preterm delivery, currently pregnant; Supervision of other normal pregnancy, antepartum; History of cholestasis during pregnancy; and Abnormal genetic test during pregnancy on their problem list.  Patient reports  itching on hands and soles of feet that started a few days ago . Patient  visited MAU x1 week ago for severe RUQ abdominal pain. Received management plan for biliary colic. Patient reports intermittent RUQ pain present.  Contractions: Not present. Vag. Bleeding: None.  Movement: Present. Denies leaking of fluid.   The following portions of the patient's history were reviewed and updated as appropriate: allergies, current medications, past family history, past medical history, past social history, past surgical history and problem list.   Objective:   Vitals:   07/31/22 1340  BP: 105/73  Pulse: (!) 112  Weight: 104 lb 6.4 oz (47.4 kg)    Fetal Status: Fetal Heart Rate (bpm): 135 Fundal Height: 33 cm Movement: Present     General:  Alert, oriented and cooperative. Patient is in no acute distress.  Skin: Skin is warm and dry. No rash noted.   Cardiovascular: Normal heart rate noted  Respiratory: Normal respiratory effort, no problems with respiration noted  Abdomen: Soft, gravid, appropriate for gestational age.  Pain/Pressure: Present     Pelvic: Cervical exam deferred        Extremities: Normal range of motion.  Edema: None  Mental Status: Normal mood and affect. Normal behavior. Normal judgment and thought content.   Assessment and Plan:  Pregnancy: G3P1102 at [redacted]w[redacted]d 1. Supervision of high risk pregnancy in third trimester - Reports frequent fetal movement  2. [redacted] weeks gestation of pregnancy - Routine follow-up  3. History of preterm  delivery, currently pregnant - At 35 weeks - Negative for S/S of early labor at today's visit  4. Abnormal genetic test during pregnancy - Genetic counseling completed  5. Biliary colic - Diagnosed with biliary colic at recent hospital admission (07/25/2022). Not taking PRN medications for pain or nausea, due to currently having moderate symptoms, and not severe.  - Bile acids, total  6. Pruritus of pregnancy in third trimester - Pruritus on hands, upper extremities and feet. Not improved with topical steroids. Plan for repeat bile acid to ensure negative for cholestasis. - Bile acids, total 7. History of cholestasis during pregnancy - Bile acids, total  Preterm labor symptoms and general obstetric precautions including but not limited to vaginal bleeding, contractions, leaking of fluid and fetal movement were reviewed in detail with the patient. Please refer to After Visit Summary for other counseling recommendations.   Return in about 29 days (around 08/29/2022) for LOB/GBS, IN-PERSON.  Future Appointments  Date Time Provider [place]  08/14/2022  1:30 PM Chancy Milroy, MD [place] None  08/15/2022  1:15 PM [facility] NURSE [facility]  08/15/2022  1:30 PM [facility] US2 [facility]  08/21/2022  1:30 PM Leftwich-Kirby, Kathie Dike, CNM [facility] None  08/28/2022  1:30 PM Deloris Ping, CNM [facility] None  09/04/2022  1:30 PM Gavin Pound, CNM [facility] None    Johnston Ebbs, NP

## 2022-08-02 ENCOUNTER — Encounter: Payer: Self-pay | Admitting: Obstetrics and Gynecology

## 2022-08-02 ENCOUNTER — Other Ambulatory Visit: Payer: Self-pay | Admitting: Obstetrics and Gynecology

## 2022-08-02 ENCOUNTER — Telehealth: Payer: Self-pay | Admitting: Emergency Medicine

## 2022-08-02 DIAGNOSIS — K831 Obstruction of bile duct: Secondary | ICD-10-CM

## 2022-08-02 LAB — BILE ACIDS, TOTAL: Bile Acids Total: 41.3 umol/L (ref 0.0–10.0)

## 2022-08-02 MED ORDER — HYDROXYZINE PAMOATE 25 MG PO CAPS: 25.0000 mg | ORAL_CAPSULE | Freq: Three times a day (TID) | ORAL | 0 refills | Status: DC | PRN

## 2022-08-02 MED ORDER — URSODIOL 500 MG PO TABS: 500.0000 mg | ORAL_TABLET | Freq: Two times a day (BID) | ORAL | 3 refills | Status: DC

## 2022-08-02 NOTE — Telephone Encounter (Signed)
TC to patient to discuss results.  Pt informed of Cholestasis diagnosis. Informed of medications sent to pharmacy to help with itching. Weekly NSTs scheduled, Delivery at 37 weeks, pt made aware.

## 2022-08-07 ENCOUNTER — Ambulatory Visit (INDEPENDENT_AMBULATORY_CARE_PROVIDER_SITE_OTHER): Payer: Medicaid Other | Admitting: *Deleted

## 2022-08-07 VITALS — BP 99/63 | HR 88 | Wt 107.4 lb

## 2022-08-07 DIAGNOSIS — O26613 Liver and biliary tract disorders in pregnancy, third trimester: Secondary | ICD-10-CM | POA: Diagnosis not present

## 2022-08-07 DIAGNOSIS — Z3A34 34 weeks gestation of pregnancy: Secondary | ICD-10-CM | POA: Diagnosis not present

## 2022-08-07 DIAGNOSIS — K831 Obstruction of bile duct: Secondary | ICD-10-CM

## 2022-08-07 DIAGNOSIS — O0993 Supervision of high risk pregnancy, unspecified, third trimester: Secondary | ICD-10-CM

## 2022-08-07 NOTE — Progress Notes (Signed)
Agree with nurses's documentation of this patient's clinic encounter.  Minervia Osso L, MD  

## 2022-08-07 NOTE — Progress Notes (Signed)
NST reviewed by Dr. Rip Harbour.

## 2022-08-07 NOTE — Progress Notes (Signed)
Arrived for weekly NST. 34.6 wks. Abnormal Panorama with this pregnancy. Current DX cholestasis due to gallstones. +FM per pt. No unusual complaints. Pt is alert and in no apparent distress. EFM placed.

## 2022-08-14 ENCOUNTER — Other Ambulatory Visit (HOSPITAL_COMMUNITY)
Admission: RE | Admit: 2022-08-14 | Discharge: 2022-08-14 | Disposition: A | Discharge status: Home / Self Care | Payer: Medicaid Other | Source: Ambulatory Visit

## 2022-08-14 ENCOUNTER — Encounter: Payer: Self-pay | Admitting: Obstetrics and Gynecology

## 2022-08-14 ENCOUNTER — Ambulatory Visit (INDEPENDENT_AMBULATORY_CARE_PROVIDER_SITE_OTHER): Payer: Medicaid Other | Admitting: Obstetrics and Gynecology

## 2022-08-14 VITALS — BP 102/67 | HR 108 | Wt 109.0 lb

## 2022-08-14 DIAGNOSIS — O285 Abnormal chromosomal and genetic finding on antenatal screening of mother: Secondary | ICD-10-CM | POA: Diagnosis not present

## 2022-08-14 DIAGNOSIS — K831 Obstruction of bile duct: Secondary | ICD-10-CM | POA: Diagnosis not present

## 2022-08-14 DIAGNOSIS — Z3A35 35 weeks gestation of pregnancy: Secondary | ICD-10-CM

## 2022-08-14 DIAGNOSIS — O26613 Liver and biliary tract disorders in pregnancy, third trimester: Secondary | ICD-10-CM

## 2022-08-14 DIAGNOSIS — Z3480 Encounter for supervision of other normal pregnancy, unspecified trimester: Secondary | ICD-10-CM

## 2022-08-14 DIAGNOSIS — Z3483 Encounter for supervision of other normal pregnancy, third trimester: Secondary | ICD-10-CM | POA: Diagnosis not present

## 2022-08-14 NOTE — Patient Instructions (Signed)

## 2022-08-14 NOTE — Progress Notes (Signed)
Subjective:  Karina [name] is a 26 y.o. G3P1102 at [redacted]w[redacted]d being seen today for ongoing prenatal care.  She is currently monitored for the following issues for this high-risk pregnancy and has History of preterm delivery, currently pregnant; Cholestasis of pregnancy in third trimester; Supervision of other normal pregnancy, antepartum; History of cholestasis during pregnancy; and Abnormal genetic test during pregnancy on their problem list.  Patient reports general discomforts of pregnancy and some itching.  Contractions: Irritability. Vag. Bleeding: None.  Movement: Present. Denies leaking of fluid.   The following portions of the patient's history were reviewed and updated as appropriate: allergies, current medications, past family history, past medical history, past social history, past surgical history and problem list. Problem list updated.  Objective:   Vitals:   08/14/22 1335  BP: 102/67  Pulse: (!) 108  Weight: 109 lb (49.4 kg)    Fetal Status: Fetal Heart Rate (bpm): NST   Movement: Present     General:  Alert, oriented and cooperative. Patient is in no acute distress.  Skin: Skin is warm and dry. No rash noted.   Cardiovascular: Normal heart rate noted  Respiratory: Normal respiratory effort, no problems with respiration noted  Abdomen: Soft, gravid, appropriate for gestational age. Pain/Pressure: Present     Pelvic:  Cervical exam performed        Extremities: Normal range of motion.  Edema: None  Mental Status: Normal mood and affect. Normal behavior. Normal judgment and thought content.   Urinalysis:      Assessment and Plan:  Pregnancy: G3P1102 at [redacted]w[redacted]d  1. Supervision of other normal pregnancy, antepartum Stable Labor precautions - Culture, beta strep (group b only) - Cervicovaginal ancillary only( [place])  2. Abnormal genetic test during pregnancy Stable  3. Cholestasis of pregnancy in third trimester IOL at 37 weeks Growth scan tomorrow - Fetal  nonstress test   NST performed today was reviewed and was found to be reactive.    Preterm labor symptoms and general obstetric precautions including but not limited to vaginal bleeding, contractions, leaking of fluid and fetal movement were reviewed in detail with the patient. Please refer to After Visit Summary for other counseling recommendations.  Return in about 1 week (around 08/21/2022) for OB visit, face to face, MD only.   Chancy Milroy, MD

## 2022-08-14 NOTE — Progress Notes (Signed)
ROB/GBS/NST.  Reports no concerns today

## 2022-08-15 ENCOUNTER — Inpatient Hospital Stay (HOSPITAL_COMMUNITY): Payer: Medicaid Other | Admitting: Anesthesiology

## 2022-08-15 ENCOUNTER — Ambulatory Visit (HOSPITAL_BASED_OUTPATIENT_CLINIC_OR_DEPARTMENT_OTHER): Payer: Medicaid Other

## 2022-08-15 ENCOUNTER — Telehealth (HOSPITAL_COMMUNITY): Payer: Self-pay | Admitting: *Deleted

## 2022-08-15 ENCOUNTER — Other Ambulatory Visit: Payer: Self-pay

## 2022-08-15 ENCOUNTER — Telehealth: Payer: Self-pay

## 2022-08-15 ENCOUNTER — Inpatient Hospital Stay (HOSPITAL_COMMUNITY)
Admission: AD | Admit: 2022-08-15 | Discharge: 2022-08-17 | DRG: 805 | Disposition: A | Discharge status: Home / Self Care | Payer: Medicaid Other | Attending: Obstetrics and Gynecology | Admitting: Obstetrics and Gynecology

## 2022-08-15 ENCOUNTER — Encounter (HOSPITAL_COMMUNITY): Payer: Self-pay

## 2022-08-15 ENCOUNTER — Encounter (HOSPITAL_COMMUNITY): Payer: Self-pay | Admitting: Obstetrics and Gynecology

## 2022-08-15 ENCOUNTER — Ambulatory Visit: Payer: Medicaid Other | Admitting: *Deleted

## 2022-08-15 VITALS — BP 119/87 | HR 115

## 2022-08-15 DIAGNOSIS — Z3A36 36 weeks gestation of pregnancy: Secondary | ICD-10-CM

## 2022-08-15 DIAGNOSIS — O26613 Liver and biliary tract disorders in pregnancy, third trimester: Secondary | ICD-10-CM | POA: Diagnosis present

## 2022-08-15 DIAGNOSIS — O09899 Supervision of other high risk pregnancies, unspecified trimester: Secondary | ICD-10-CM | POA: Insufficient documentation

## 2022-08-15 DIAGNOSIS — Z23 Encounter for immunization: Secondary | ICD-10-CM | POA: Diagnosis not present

## 2022-08-15 DIAGNOSIS — O2662 Liver and biliary tract disorders in childbirth: Secondary | ICD-10-CM

## 2022-08-15 DIAGNOSIS — O26649 Intrahepatic cholestasis of pregnancy, unspecified trimester: Secondary | ICD-10-CM | POA: Diagnosis present

## 2022-08-15 DIAGNOSIS — O09293 Supervision of pregnancy with other poor reproductive or obstetric history, third trimester: Secondary | ICD-10-CM

## 2022-08-15 DIAGNOSIS — O280 Abnormal hematological finding on antenatal screening of mother: Secondary | ICD-10-CM | POA: Insufficient documentation

## 2022-08-15 DIAGNOSIS — K831 Obstruction of bile duct: Secondary | ICD-10-CM | POA: Diagnosis present

## 2022-08-15 DIAGNOSIS — O285 Abnormal chromosomal and genetic finding on antenatal screening of mother: Secondary | ICD-10-CM | POA: Diagnosis present

## 2022-08-15 DIAGNOSIS — O09213 Supervision of pregnancy with history of pre-term labor, third trimester: Secondary | ICD-10-CM

## 2022-08-15 DIAGNOSIS — O26643 Intrahepatic cholestasis of pregnancy, third trimester: Secondary | ICD-10-CM | POA: Diagnosis present

## 2022-08-15 DIAGNOSIS — Z975 Presence of (intrauterine) contraceptive device: Secondary | ICD-10-CM

## 2022-08-15 DIAGNOSIS — Z30430 Encounter for insertion of intrauterine contraceptive device: Secondary | ICD-10-CM

## 2022-08-15 DIAGNOSIS — Z3480 Encounter for supervision of other normal pregnancy, unspecified trimester: Secondary | ICD-10-CM

## 2022-08-15 DIAGNOSIS — O42913 Preterm premature rupture of membranes, unspecified as to length of time between rupture and onset of labor, third trimester: Secondary | ICD-10-CM

## 2022-08-15 DIAGNOSIS — O26893 Other specified pregnancy related conditions, third trimester: Secondary | ICD-10-CM | POA: Diagnosis present

## 2022-08-15 DIAGNOSIS — O26619 Liver and biliary tract disorders in pregnancy, unspecified trimester: Secondary | ICD-10-CM | POA: Diagnosis present

## 2022-08-15 LAB — CBC
HCT: 32.5 % — ABNORMAL LOW (ref 36.0–46.0)
Hemoglobin: 10.4 g/dL — ABNORMAL LOW (ref 12.0–15.0)
MCH: 23 pg — ABNORMAL LOW (ref 26.0–34.0)
MCHC: 32 g/dL (ref 30.0–36.0)
MCV: 71.9 fL — ABNORMAL LOW (ref 80.0–100.0)
Platelets: 222 10*3/uL (ref 150–400)
RBC: 4.52 MIL/uL (ref 3.87–5.11)
RDW: 16 % — ABNORMAL HIGH (ref 11.5–15.5)
WBC: 10.6 10*3/uL — ABNORMAL HIGH (ref 4.0–10.5)
nRBC: 0 % (ref 0.0–0.2)

## 2022-08-15 LAB — CERVICOVAGINAL ANCILLARY ONLY
Bacterial Vaginitis (gardnerella): POSITIVE — AB
Candida Glabrata: NEGATIVE
Candida Vaginitis: NEGATIVE
Chlamydia: NEGATIVE
Comment: NEGATIVE
Comment: NEGATIVE
Comment: NEGATIVE
Comment: NEGATIVE
Comment: NEGATIVE
Comment: NORMAL
Neisseria Gonorrhea: NEGATIVE
Trichomonas: NEGATIVE

## 2022-08-15 LAB — TYPE AND SCREEN
ABO/RH(D): A POS
Antibody Screen: NEGATIVE

## 2022-08-15 MED ORDER — ONDANSETRON HCL 4 MG/2ML IJ SOLN: 4.0000 mg | Freq: Four times a day (QID) | INTRAMUSCULAR | Status: DC | PRN

## 2022-08-15 MED ORDER — LACTATED RINGERS IV SOLN
500.0000 mL | Freq: Once | INTRAVENOUS | Status: AC
  Administered 2022-08-15: 500 mL via INTRAVENOUS

## 2022-08-15 MED ORDER — PENICILLIN G POTASSIUM 5000000 UNITS IJ SOLR: 5.0000 10*6.[IU] | Freq: Once | INTRAMUSCULAR | Status: DC

## 2022-08-15 MED ORDER — PHENYLEPHRINE 80 MCG/ML (10ML) SYRINGE FOR IV PUSH (FOR BLOOD PRESSURE SUPPORT): 80.0000 ug | PREFILLED_SYRINGE | INTRAVENOUS | Status: DC | PRN

## 2022-08-15 MED ORDER — SIMETHICONE 80 MG PO CHEW: 80.0000 mg | CHEWABLE_TABLET | ORAL | Status: DC | PRN

## 2022-08-15 MED ORDER — METHYLERGONOVINE MALEATE 0.2 MG PO TABS
0.2000 mg | ORAL_TABLET | ORAL | Status: DC | PRN
  Administered 2022-08-15 – 2022-08-16 (×2): 0.2 mg via ORAL
  Filled 2022-08-15 (×2): qty 1

## 2022-08-15 MED ORDER — ONDANSETRON HCL 4 MG/2ML IJ SOLN: 4.0000 mg | INTRAMUSCULAR | Status: DC | PRN

## 2022-08-15 MED ORDER — EPHEDRINE 5 MG/ML INJ: 10.0000 mg | INTRAVENOUS | Status: DC | PRN

## 2022-08-15 MED ORDER — ACETAMINOPHEN 325 MG PO TABS
650.0000 mg | ORAL_TABLET | ORAL | Status: DC | PRN
  Administered 2022-08-15 – 2022-08-16 (×2): 650 mg via ORAL
  Filled 2022-08-15 (×2): qty 2

## 2022-08-15 MED ORDER — OXYCODONE-ACETAMINOPHEN 5-325 MG PO TABS: 2.0000 | ORAL_TABLET | ORAL | Status: DC | PRN

## 2022-08-15 MED ORDER — ACETAMINOPHEN 325 MG PO TABS: 650.0000 mg | ORAL_TABLET | ORAL | Status: DC | PRN

## 2022-08-15 MED ORDER — FENTANYL CITRATE (PF) 100 MCG/2ML IJ SOLN: 50.0000 ug | INTRAMUSCULAR | Status: DC | PRN

## 2022-08-15 MED ORDER — MISOPROSTOL 200 MCG PO TABS
800.0000 ug | ORAL_TABLET | Freq: Once | ORAL | Status: AC
  Administered 2022-08-15: 800 ug via RECTAL

## 2022-08-15 MED ORDER — FLEET ENEMA 7-19 GM/118ML RE ENEM: 1.0000 | ENEMA | Freq: Every day | RECTAL | Status: DC | PRN

## 2022-08-15 MED ORDER — SODIUM CHLORIDE 0.9 % IV SOLN
5.0000 10*6.[IU] | Freq: Once | INTRAVENOUS | Status: AC
  Administered 2022-08-15: 5 10*6.[IU] via INTRAVENOUS
  Filled 2022-08-15: qty 5

## 2022-08-15 MED ORDER — DIBUCAINE (PERIANAL) 1 % EX OINT: 1.0000 | TOPICAL_OINTMENT | CUTANEOUS | Status: DC | PRN

## 2022-08-15 MED ORDER — PENICILLIN G POTASSIUM 5000000 UNITS IJ SOLR: 3.0000 10*6.[IU] | INTRAMUSCULAR | Status: DC

## 2022-08-15 MED ORDER — PRENATAL MULTIVITAMIN CH
1.0000 | ORAL_TABLET | Freq: Every day | ORAL | Status: DC
  Administered 2022-08-16 – 2022-08-17 (×2): 1 via ORAL
  Filled 2022-08-15 (×2): qty 1

## 2022-08-15 MED ORDER — LACTATED RINGERS IV SOLN: INTRAVENOUS | Status: DC

## 2022-08-15 MED ORDER — PHENYLEPHRINE 80 MCG/ML (10ML) SYRINGE FOR IV PUSH (FOR BLOOD PRESSURE SUPPORT)
80.0000 ug | PREFILLED_SYRINGE | INTRAVENOUS | Status: DC | PRN
  Filled 2022-08-15: qty 10

## 2022-08-15 MED ORDER — IBUPROFEN 600 MG PO TABS
600.0000 mg | ORAL_TABLET | Freq: Four times a day (QID) | ORAL | Status: DC
  Administered 2022-08-15 – 2022-08-17 (×7): 600 mg via ORAL
  Filled 2022-08-15 (×7): qty 1

## 2022-08-15 MED ORDER — FERROUS SULFATE 325 (65 FE) MG PO TABS
325.0000 mg | ORAL_TABLET | ORAL | Status: DC
  Administered 2022-08-16: 325 mg via ORAL
  Filled 2022-08-15: qty 1

## 2022-08-15 MED ORDER — DIPHENHYDRAMINE HCL 50 MG/ML IJ SOLN: 12.5000 mg | INTRAMUSCULAR | Status: DC | PRN

## 2022-08-15 MED ORDER — LIDOCAINE-EPINEPHRINE (PF) 1.5 %-1:200000 IJ SOLN
INTRAMUSCULAR | Status: DC | PRN
  Administered 2022-08-15: 5 mL via EPIDURAL

## 2022-08-15 MED ORDER — ONDANSETRON HCL 4 MG PO TABS: 4.0000 mg | ORAL_TABLET | ORAL | Status: DC | PRN

## 2022-08-15 MED ORDER — OXYTOCIN BOLUS FROM INFUSION
333.0000 mL | Freq: Once | INTRAVENOUS | Status: AC
  Administered 2022-08-15: 333 mL via INTRAVENOUS

## 2022-08-15 MED ORDER — METHYLERGONOVINE MALEATE 0.2 MG/ML IJ SOLN: 0.2000 mg | INTRAMUSCULAR | Status: DC | PRN

## 2022-08-15 MED ORDER — LIDOCAINE HCL (PF) 1 % IJ SOLN: 30.0000 mL | INTRAMUSCULAR | Status: DC | PRN

## 2022-08-15 MED ORDER — LEVONORGESTREL 20 MCG/DAY IU IUD
1.0000 | INTRAUTERINE_SYSTEM | Freq: Once | INTRAUTERINE | Status: AC
  Administered 2022-08-15: 1 via INTRAUTERINE
  Filled 2022-08-15: qty 1

## 2022-08-15 MED ORDER — TETANUS-DIPHTH-ACELL PERTUSSIS 5-2.5-18.5 LF-MCG/0.5 IM SUSY: 0.5000 mL | PREFILLED_SYRINGE | Freq: Once | INTRAMUSCULAR | Status: DC

## 2022-08-15 MED ORDER — SOD CITRATE-CITRIC ACID 500-334 MG/5ML PO SOLN: 30.0000 mL | ORAL | Status: DC | PRN

## 2022-08-15 MED ORDER — WITCH HAZEL-GLYCERIN EX PADS: 1.0000 | MEDICATED_PAD | CUTANEOUS | Status: DC | PRN

## 2022-08-15 MED ORDER — MEASLES, MUMPS & RUBELLA VAC IJ SOLR: 0.5000 mL | Freq: Once | INTRAMUSCULAR | Status: DC

## 2022-08-15 MED ORDER — MEDROXYPROGESTERONE ACETATE 150 MG/ML IM SUSP: 150.0000 mg | INTRAMUSCULAR | Status: DC | PRN

## 2022-08-15 MED ORDER — LIDOCAINE HCL (PF) 1 % IJ SOLN
INTRAMUSCULAR | Status: DC | PRN
  Administered 2022-08-15: 5 mL via EPIDURAL

## 2022-08-15 MED ORDER — DIPHENHYDRAMINE HCL 25 MG PO CAPS: 25.0000 mg | ORAL_CAPSULE | Freq: Four times a day (QID) | ORAL | Status: DC | PRN

## 2022-08-15 MED ORDER — OXYTOCIN-SODIUM CHLORIDE 30-0.9 UT/500ML-% IV SOLN
2.5000 [IU]/h | INTRAVENOUS | Status: DC
  Administered 2022-08-15: 2.5 [IU]/h via INTRAVENOUS
  Filled 2022-08-15: qty 500

## 2022-08-15 MED ORDER — FENTANYL-BUPIVACAINE-NACL 0.5-0.125-0.9 MG/250ML-% EP SOLN
12.0000 mL/h | EPIDURAL | Status: DC | PRN
  Administered 2022-08-15: 12 mL/h via EPIDURAL
  Filled 2022-08-15: qty 250

## 2022-08-15 MED ORDER — PENICILLIN G POT IN DEXTROSE 60000 UNIT/ML IV SOLN
3.0000 10*6.[IU] | INTRAVENOUS | Status: DC
  Filled 2022-08-15 (×2): qty 50

## 2022-08-15 MED ORDER — SENNOSIDES-DOCUSATE SODIUM 8.6-50 MG PO TABS
2.0000 | ORAL_TABLET | ORAL | Status: DC
  Administered 2022-08-16: 2 via ORAL
  Filled 2022-08-15: qty 2

## 2022-08-15 MED ORDER — MISOPROSTOL 200 MCG PO TABS
ORAL_TABLET | ORAL | Status: AC
  Filled 2022-08-15: qty 4

## 2022-08-15 MED ORDER — OXYCODONE-ACETAMINOPHEN 5-325 MG PO TABS: 1.0000 | ORAL_TABLET | ORAL | Status: DC | PRN

## 2022-08-15 MED ORDER — BENZOCAINE-MENTHOL 20-0.5 % EX AERO
1.0000 | INHALATION_SPRAY | CUTANEOUS | Status: DC | PRN
  Filled 2022-08-15: qty 56

## 2022-08-15 MED ORDER — COCONUT OIL OIL: 1.0000 | TOPICAL_OIL | Status: DC | PRN

## 2022-08-15 MED ORDER — BISACODYL 10 MG RE SUPP: 10.0000 mg | Freq: Every day | RECTAL | Status: DC | PRN

## 2022-08-15 MED ORDER — LACTATED RINGERS IV SOLN: 500.0000 mL | INTRAVENOUS | Status: DC | PRN

## 2022-08-15 NOTE — MAU Note (Signed)
Karina [name] is a 26 y.o. at [redacted]w[redacted]d here in MAU reporting: contractions started around 12 and they are every 2 min. Denies bleeding or LOF. +FM  Onset of complaint: today  Pain score: 7/10  Vitals:   08/15/22 1413  BP: 118/85  Pulse: (!) 103  Resp: 16  Temp: 98.2 F (36.8 C)  SpO2: 100%     FHT:+FM, Efm applied in room  Lab orders placed from triage: none

## 2022-08-15 NOTE — Progress Notes (Signed)
Pt c/o u/c's q 2-3 min consistently for the past hour with vag pressure ( she sits on one hip), she says she can tolerate her u/s appt; she was encouraged to go to MAU afterward. Denies any vag bleed or leaking fluid. Good FM.[phone]

## 2022-08-15 NOTE — Discharge Summary (Signed)
Postpartum Discharge Summary     Patient Name: Karina [name] DOB: [birth year] MRN: [id number]  Date of admission: 08/15/2022 Delivery date:08/15/2022  Delivering provider: Christin Fudge  Date of discharge: 08/17/2022  Admitting diagnosis: Cholestasis during pregnancy [O26.619, K83.1] Intrauterine pregnancy: [redacted]w[redacted]d    Secondary diagnosis:  Principal Problem:   Normal labor Active Problems:   History of preterm delivery, currently pregnant   Cholestasis of pregnancy in third trimester   Supervision of other normal pregnancy, antepartum   Abnormal genetic test during pregnancy   Cholestasis during pregnancy  Additional problems: N/A    Discharge diagnosis: Preterm Pregnancy Delivered                                              Post partum procedures: Postplacental Mirena IUD placed Augmentation: none Complications: None  Hospital course: Onset of Labor With Vaginal Delivery      26y.o. yo [id number] admitted in Active Labor on 08/15/2022. Labor course was complicated by nothing  Membrane Rupture Time/Date: 6:41 PM ,08/15/2022   Delivery Method:Vaginal, Spontaneous  Episiotomy: None  Lacerations:  1st degree  Patient had an uncomplicated postpartum course.  She is ambulating, tolerating a regular diet, passing flatus, and urinating well. Patient is discharged home in stable condition on 08/17/22.  Newborn Data: Birth date:08/15/2022  Birth time:7:06 PM  Gender:Female  Living status:Living  Apgars:8 ,6 , 9 Weight: 3130g  Magnesium Sulfate received: No BMZ received: No Rhophylac:N/A MMR:N/A T-DaP:Given prenatally Flu: Yes Transfusion:No  Physical exam  Vitals:   08/15/22 1600 08/15/22 1655 08/15/22 1700 08/15/22 1925  BP: (!) 100/53 106/77 107/75 114/69  Pulse: 97 93 86 81  Resp:      Temp:      TempSrc:      SpO2:      Weight:      Height:       General: alert, cooperative, and no distress Lochia: appropriate Uterine Fundus:  firm Incision: N/A DVT Evaluation: No evidence of DVT seen on physical exam. Labs: Lab Results  Component Value Date   WBC 10.6 (H) 08/15/2022   HGB 10.4 (L) 08/15/2022   HCT 32.5 (L) 08/15/2022   MCV 71.9 (L) 08/15/2022   PLT 222 08/15/2022      Latest Ref Rng & Units 07/25/2022    2:38 AM  CMP  Glucose 70 - 99 mg/dL 84   BUN 6 - 20 mg/dL 6   Creatinine 0.44 - 1.00 mg/dL 0.47   Sodium 135 - 145 mmol/L 140   Potassium 3.5 - 5.1 mmol/L 3.6   Chloride 98 - 111 mmol/L 108   CO2 22 - 32 mmol/L 21   Calcium 8.9 - 10.3 mg/dL 8.3   Total Protein 6.5 - 8.1 g/dL 5.9   Total Bilirubin 0.3 - 1.2 mg/dL 0.5   Alkaline Phos 38 - 126 U/L 124   AST 15 - 41 U/L 43   ALT 0 - 44 U/L 53    Edinburgh Score:    05/03/2021    2:18 PM  Edinburgh Postnatal Depression Scale Screening Tool  I have been able to laugh and see the funny side of things. 0  I have looked forward with enjoyment to things. 0  I have blamed myself unnecessarily when things went wrong. 0  I have been anxious or worried for no  good reason. 0  I have felt scared or panicky for no good reason. 0  Things have been getting on top of me. 0  I have been so unhappy that I have had difficulty sleeping. 0  I have felt sad or miserable. 0  I have been so unhappy that I have been crying. 0  The thought of harming myself has occurred to me. 0  Edinburgh Postnatal Depression Scale Total 0     After visit meds:  Allergies as of 08/17/2022   No Known Allergies      Medication List     STOP taking these medications    ferrous sulfate 324 (65 Fe) MG Tbec   hydrOXYzine 25 MG capsule Commonly known as: Vistaril   ondansetron 4 MG disintegrating tablet Commonly known as: ZOFRAN-ODT   ursodiol 500 MG tablet Commonly known as: ACTIGALL       TAKE these medications    acetaminophen 325 MG tablet Commonly known as: Tylenol Take 2 tablets (650 mg total) by mouth every 4 (four) hours as needed for moderate pain (for pain  scale < 4).   benzocaine-Menthol 20-0.5 % Aero Commonly known as: DERMOPLAST Apply 1 Application topically as needed for irritation (perineal discomfort).   ibuprofen 600 MG tablet Commonly known as: ADVIL Take 1 tablet (600 mg total) by mouth every 6 (six) hours.   Prenatal 28-0.8 MG Tabs Take 1 tablet by mouth daily.   witch hazel-glycerin pad Commonly known as: TUCKS Apply 1 Application topically as needed for hemorrhoids.         Discharge home in stable condition Infant Feeding: Breast Infant Disposition:home with mother Discharge instruction: per After Visit Summary and Postpartum booklet. Activity: Advance as tolerated. Pelvic rest for 6 weeks.  Diet: routine diet Future Appointments: Future Appointments  Date Time Provider [place]  08/21/2022  1:30 PM Leftwich-Kirby, Kathie Dike, CNM [facility] None  08/22/2022  6:30 AM [facility] [place] [facility] None  08/28/2022  1:30 PM Deloris Ping, CNM [facility] None  09/04/2022  1:30 PM Gavin Pound, CNM [facility] None   Follow up Visit:   Please schedule this patient for a In person postpartum visit in 4 weeks with the following provider: Any provider. Additional Postpartum F/U: IUD check   Low risk pregnancy complicated by: PTL, cholestasis Delivery mode:  Vaginal, Spontaneous  Anticipated Birth Control:  PP IUD placed   Mallie Snooks, [place], [place], MSN, CNM Certified Nurse Midwife, Product/process development scientist for [employer], [place]

## 2022-08-15 NOTE — Telephone Encounter (Signed)
Preadmission screen  

## 2022-08-15 NOTE — Progress Notes (Signed)
POST-PLACENTAL IUD INSERTION PROCEDURE NOTE Patient name: INFINITY JEFFORDS MRN [id number]  Date of birth: 1996/09/17  The risks and benefits of the method and placement have been thouroughly reviewed with the patient and all questions were answered.  Specifically the patient is aware of failure rate of 11/998, expulsion of the IUD and of possible perforation.  The patient is aware of irregular bleeding due to the method and understands the incidence of irregular bleeding diminishes with time.   Signed copy of informed consent in chart.   Vaginal, labial and perineal areas thoroughly inspected for lacerations. Lacerations: 1st degree laceration identified and if needed was repaired prior to IUD insertion  Pt's IUD choice: Mirena  Time out was performed.     IUD inserted with manufactures directions.   Strings trimmed to the level of the introitus. Patient tolerated procedure well.  Patient given post procedure instructions and IUD care card with expiration date.  Patient is asked to keep IUD strings tucked in her vagina until her postpartum follow up visit in 4-6 weeks. Patient advised to abstain from sexual intercourse and pulling on strings before her follow-up visit. Patient verbalized an understanding of the plan of care and agrees.   Pt added to the post-placental IUD insertion list and charges entered.  Christin Fudge CNM 08/15/2022 8:13 PM

## 2022-08-15 NOTE — Telephone Encounter (Signed)
Pt is aware of results, pt stated that she is currently in labor, will be treated at hospital

## 2022-08-15 NOTE — H&P (Signed)
OBSTETRIC ADMISSION HISTORY AND PHYSICAL  Karina [name] is a 26 y.o. female [phone] with IUP at [redacted]w[redacted]d by LMP presenting for SOL. She reports +FMs, No LOF, no VB, no blurry vision, headaches or peripheral edema, and RUQ pain.  She plans on breast and bottle feeding. She request PP MIRENA IUD for birth control. She received her prenatal care at  [place]: By LMP --->  Estimated Date of Delivery: 09/12/22  Sono:   @36w , CWD, normal anatomy, cephalic presentation, [id number], 97% EFW   Prenatal History/Complications:  Patient Active Problem List   Diagnosis Date Noted   Abnormal genetic test during pregnancy 03/28/2022   Supervision of other normal pregnancy, antepartum 03/07/2022   History of cholestasis during pregnancy 03/07/2022   Cholestasis of pregnancy in third trimester 03/21/2021   Normal labor 03/21/2021   History of preterm delivery, currently pregnant 11/09/2020    Nursing Staff Provider  Office Location  [place] Dating  LMP  [facility] Model [X]  Traditional [ ]  Centering [ ]  Mom-Baby Dyad    Language   ENGLISH Anatomy US  04/18/2022 - normal   Flu Vaccine   09/09/2021 Genetic/Carrier Screen  NIPS:   XYY female AFP:  neg Horizon: neg 14/14  TDaP Vaccine   06-19-22 Hgb A1C or  GTT Early: 5.1 Third trimester normal  COVID Vaccine  YES   LAB RESULTS   Rhogam   A+ Blood Type --/--/A POS (09/14 [id number])   Baby Feeding Plan  BREAST & BOTTLE Antibody NEG (09/14 0607)  Contraception  IUD, Mirena PP Rubella 1.37 (04/27 [id number])  Circumcision  Yes, IP RPR Non Reactive (08/09 0939)   Pediatrician   [facility] HBsAg Negative (04/27 0952)   Support Person  SO-Julian HCVAb Negative  Prenatal Classes  Info given HIV Non Reactive (08/09 0939)     BTL Consent  NA GBS  (For PCN allergy, check sensitivities)   VBAC Consent  NA Pap 10/2020 - NILM/HPV neg       DME Rx [X]  BP cuff [X]  Weight Scale Waterbirth  [ ]  Class [ ]  Consent [ ]  CNM visit  PHQ9 & GAD7 Valu.Nieves  ] new OB - 0/2 [ x ] 28  weeks  Valu.Nieves ] 36 weeks Induction  [ ]  Orders Entered [ ] Foley Y/N    Past Medical History: Past Medical History:  Diagnosis Date   Cholestasis during pregnancy    Cholestasis during pregnancy in third trimester 03/21/2021   History of preterm delivery 11/09/2020   G1, PTL and delivery at [redacted]w[redacted]d   Preterm labor    SVD (spontaneous vaginal delivery) 03/21/2021    Past Surgical History: Past Surgical History:  Procedure Laterality Date   NO PAST SURGERIES      Obstetrical History: OB History     Gravida  3   Para  2   Term  1   Preterm  1   AB      Living  2      SAB      IAB      Ectopic      Multiple  0   Live Births  2           Social History Social History   Socioeconomic History   Marital status: Single    Spouse name: Shea Stakes   Number of children: 2   Years of education: Not on file   Highest education level: Not on file  Occupational History   Occupation: [employer]  Tobacco Use   Smoking status: Never   Smokeless tobacco: Never  Vaping Use   Vaping Use: Never used  Substance and Sexual Activity   Alcohol use: No   Drug use: No    Types: Marijuana    Comment: prior to first preg   Sexual activity: Yes    Birth control/protection: None  Other Topics Concern   Not on file  Social History Narrative   Not on file   Social Determinants of Health   Financial Resource Strain: Not on file  Food Insecurity: Not on file  Transportation Needs: Not on file  Physical Activity: Not on file  Stress: Not on file  Social Connections: Not on file    Family History: Family History  Problem Relation Age of Onset   Healthy Mother    Hyperlipidemia Father    Diabetes Father     Allergies: No Known Allergies  Medications Prior to Admission  Medication Sig Dispense Refill Last Dose   ferrous sulfate 324 (65 Fe) MG TBEC Take 1 tablet (325 mg total) by mouth every other day. 30 tablet 11    hydrOXYzine (VISTARIL) 25 MG  capsule Take 1 capsule (25 mg total) by mouth 3 (three) times daily as needed. 30 capsule 0    ondansetron (ZOFRAN-ODT) 4 MG disintegrating tablet Take 1 tablet (4 mg total) by mouth every 6 (six) hours as needed for nausea. 20 tablet 0    PRENATAL 28-0.8 MG TABS Take 1 tablet by mouth daily. 30 each 12    ursodiol (ACTIGALL) 500 MG tablet Take 1 tablet (500 mg total) by mouth 2 (two) times daily. 60 tablet 3      Review of Systems   All systems reviewed and negative except as stated in HPI  Blood pressure 118/85, pulse (!) 103, temperature 98.2 F (36.8 C), temperature source Oral, resp. rate 16, height 5\' 1"  (1.549 m), weight 48.6 kg, last menstrual period 12/06/2021, SpO2 100 %, currently breastfeeding. General appearance: alert, cooperative, and appears stated age Lungs: clear to auscultation bilaterally Heart: regular rate and rhythm Abdomen: soft, non-tender; bowel sounds normal Extremities: Homans sign is negative, no sign of DVT Presentation: cephalic Fetal monitoringBaseline: 130 bpm, Variability: Fair (1-6 bpm), Accelerations: Reactive, and Decelerations: Absent Uterine activityFrequency: Every 1-2 minutes Dilation: 4 Effacement (%): 70 Station: 0, Plus 1 Exam by:: holly flippin rn   Prenatal labs: ABO, Rh: --/--/A POS (09/14 1191) Antibody: NEG (09/14 4782) Rubella: 1.37 (04/27 0952) RPR: Non Reactive (08/09 0939)  HBsAg: Negative (04/27 0952)  HIV: Non Reactive (08/09 0939)  GBS:    2 hr Glucola nml Genetic screening  XYY female Anatomy US mnl  Prenatal Transfer Tool  Maternal Diabetes: No Genetic Screening: Abnormal:  Results: Other: XYY FEMALE Maternal Ultrasounds/Referrals: Normal Fetal Ultrasounds or other Referrals:  None Maternal Substance Abuse:  No Significant Maternal Medications:  None Significant Maternal Lab Results:  Other:  GBS pending Number of Prenatal Visits:greater than 3 verified prenatal visits Other Comments:  None  No results found for  this or any previous visit (from the past 24 hour(s)).  Patient Active Problem List   Diagnosis Date Noted   Abnormal genetic test during pregnancy 03/28/2022   Supervision of other normal pregnancy, antepartum 03/07/2022   History of cholestasis during pregnancy 03/07/2022   Cholestasis of pregnancy in third trimester 03/21/2021   Normal labor 03/21/2021   History of preterm delivery, currently pregnant 11/09/2020    Assessment/Plan:  Katy Apo A  Cirilo is a 26 y.o. [id number] at [redacted]w[redacted]d here for SOL  #Labor:Contracting well, provide expectant management #Pain: Support, Epidural upon req #FWB: CAT 1 #ID:  GBS cx pending, PCN ppx #MOF: Breast #MOC: PP Mirena IUD, will consent  #Circ:  YES #cholestasis: BL 41, on ursodiol #XYY female: NICU aware, no birth issues expected  Shelda Pal, DO  08/15/2022, 2:43 PM

## 2022-08-15 NOTE — Anesthesia Procedure Notes (Signed)
Epidural Patient location during procedure: OB Start time: 08/15/2022 3:09 PM End time: 08/15/2022 3:19 PM  Staffing Anesthesiologist: Murvin Natal, MD Performed: anesthesiologist   Preanesthetic Checklist Completed: patient identified, IV checked, site marked, risks and benefits discussed, monitors and equipment checked, pre-op evaluation and timeout performed  Epidural Patient position: sitting Prep: DuraPrep Patient monitoring: heart rate, cardiac monitor, continuous pulse ox and blood pressure Approach: midline Location: L3-L4 Injection technique: LOR air  Needle:  Needle type: Tuohy  Needle gauge: 17 G Needle length: 9 cm Needle insertion depth: 4 cm Catheter type: closed end flexible Catheter size: 19 Gauge Catheter at skin depth: 9 cm Test dose: negative and 1.5% lidocaine with Epi 1:200 K  Assessment Events: blood not aspirated, injection not painful, no injection resistance and negative IV test  Additional Notes Informed consent obtained prior to proceeding including risk of failure, 1% risk of PDPH, risk of minor discomfort and bruising. Discussed alternatives to epidural analgesia and patient desires to proceed.  Timeout performed pre-procedure verifying patient name, procedure, and platelet count.  Patient tolerated procedure well. Reason for block:procedure for pain

## 2022-08-15 NOTE — Anesthesia Preprocedure Evaluation (Signed)
Anesthesia Evaluation  Patient identified by MRN, date of birth, ID band Patient awake    Reviewed: Allergy & Precautions, H&P , NPO status , Patient's Chart, lab work & pertinent test results  History of Anesthesia Complications Negative for: history of anesthetic complications  Airway Mallampati: II  TM Distance: >3 FB Neck ROM: full    Dental no notable dental hx. (+) Teeth Intact   Pulmonary neg pulmonary ROS,    Pulmonary exam normal breath sounds clear to auscultation       Cardiovascular negative cardio ROS Normal cardiovascular exam Rhythm:regular Rate:Normal     Neuro/Psych negative neurological ROS  negative psych ROS   GI/Hepatic negative GI ROS, Neg liver ROS,   Endo/Other  negative endocrine ROS  Renal/GU negative Renal ROS  negative genitourinary   Musculoskeletal   Abdominal   Peds  Hematology  (+) Blood dyscrasia, anemia ,   Anesthesia Other Findings   Reproductive/Obstetrics (+) Pregnancy                             Anesthesia Physical Anesthesia Plan  ASA: 2  Anesthesia Plan: Epidural   Post-op Pain Management:    Induction:   PONV Risk Score and Plan:   Airway Management Planned:   Additional Equipment:   Intra-op Plan:   Post-operative Plan:   Informed Consent: I have reviewed the patients History and Physical, chart, labs and discussed the procedure including the risks, benefits and alternatives for the proposed anesthesia with the patient or authorized representative who has indicated his/her understanding and acceptance.       Plan Discussed with:   Anesthesia Plan Comments:         Anesthesia Quick Evaluation

## 2022-08-16 LAB — RPR: RPR Ser Ql: NONREACTIVE

## 2022-08-16 MED ORDER — INFLUENZA VAC SPLIT QUAD 0.5 ML IM SUSY
0.5000 mL | PREFILLED_SYRINGE | INTRAMUSCULAR | Status: AC
  Administered 2022-08-16: 0.5 mL via INTRAMUSCULAR
  Filled 2022-08-16: qty 0.5

## 2022-08-16 NOTE — Anesthesia Postprocedure Evaluation (Signed)
Anesthesia Post Note  Patient: Karina [name]  Procedure(s) Performed: AN AD HOC LABOR EPIDURAL     Patient location during evaluation: Mother Baby Anesthesia Type: Epidural Level of consciousness: awake and alert Pain management: pain level controlled Vital Signs Assessment: post-procedure vital signs reviewed and stable Respiratory status: spontaneous breathing, nonlabored ventilation and respiratory function stable Cardiovascular status: stable Postop Assessment: no headache, no backache, epidural receding, no apparent nausea or vomiting, patient able to bend at knees, adequate PO intake and able to ambulate Anesthetic complications: no   No notable events documented.  Last Vitals:  Vitals:   08/16/22 0215 08/16/22 0550  BP: 112/76 106/67  Pulse: 96 70  Resp: 18 18  Temp: 37.6 C 36.8 C  SpO2: 97% 97%    Last Pain:  Vitals:   08/16/22 0550  TempSrc: Oral  PainSc: 5    Pain Goal:                   Jabier Mutton

## 2022-08-16 NOTE — Progress Notes (Signed)
Post Partum Day 1 Subjective: no complaints, up ad lib, voiding and tolerating PO, small lochia, plans to breastfeed, IUD placed  Objective: Blood pressure 106/67, pulse 70, temperature 98.3 F (36.8 C), temperature source Oral, resp. rate 18, height 5\' 1"  (1.549 m), weight 48.6 kg, last menstrual period 12/06/2021, SpO2 97 %, unknown if currently breastfeeding.  Physical Exam:  General: alert, cooperative and no distress Lochia:normal flow Chest: CTAB Heart: RRR no m/r/g Abdomen: +BS, soft, nontender,  Uterine Fundus: firm DVT Evaluation: No evidence of DVT seen on physical exam. Extremities: no edema  Recent Labs    08/15/22 1435  HGB 10.4*  HCT 32.5*    Assessment/Plan: Breastfeeding and Lactation consult   LOS: 1 day   Christin Fudge 08/16/2022, 8:24 AM

## 2022-08-16 NOTE — Lactation Note (Signed)
This note was copied from a baby's chart. Lactation Consultation Note  Patient Name: Karina [name] [id number] Date: 08/16/2022 Reason for consult: Initial assessment;Late-preterm 34-36.6wks Age:26 hours Mom originally didn't want to see Lactation. RN talked w/mom about seeing Lactation since baby is 36 wks. LPI. Mom hasn't put the baby to the breast yet just given formula. Mom is BF/formula feeding. Mom has DEBP at bedside. Mom has been tired not feeling like doing anything yet. Gave mom LPI information sheet.reviewed supplementation amount is as if mom is BF first then supplement w/formula after BF. Mom doesn't have any questions at this time. Encouraged to ask for assistance or if has questions. Mom thanked [facility] for coming.  Maternal Data Does the patient have breastfeeding experience prior to this delivery?: Yes How long did the patient breastfeed?: 7 weeks to her 2nd child  Feeding Nipple Type: Slow - flow  LATCH Score                    Lactation Tools Discussed/Used Tools: Pump Breast pump type: Double-Electric Breast Pump Reason for Pumping: LPI Pumping frequency: q3h  Interventions Interventions: DEBP;LC Services brochure;LPT handout/interventions;Breast feeding basics reviewed  Discharge    Consult Status Consult Status: Follow-up Date: 08/17/22 Follow-up type: In-patient    [name], Elta Guadeloupe 08/16/2022, 4:16 AM

## 2022-08-17 MED ORDER — WITCH HAZEL-GLYCERIN EX PADS: 1.0000 | MEDICATED_PAD | CUTANEOUS | 12 refills | Status: DC | PRN

## 2022-08-17 MED ORDER — BENZOCAINE-MENTHOL 20-0.5 % EX AERO: 1.0000 | INHALATION_SPRAY | CUTANEOUS | 0 refills | Status: DC | PRN

## 2022-08-17 MED ORDER — IBUPROFEN 600 MG PO TABS: 600.0000 mg | ORAL_TABLET | Freq: Four times a day (QID) | ORAL | 0 refills | Status: DC

## 2022-08-17 MED ORDER — ACETAMINOPHEN 325 MG PO TABS: 650.0000 mg | ORAL_TABLET | ORAL | 0 refills | Status: AC | PRN

## 2022-08-17 NOTE — Discharge Instructions (Signed)

## 2022-08-17 NOTE — Plan of Care (Signed)
  Problem: Education: Goal: Knowledge of disease or condition will improve Outcome: Completed/Met   Problem: Clinical Measurements: Goal: Complications related to the disease process, condition or treatment will be avoided or minimized Outcome: Completed/Met   Problem: Health Behavior/Discharge Planning: Goal: Ability to manage health-related needs will improve Outcome: Completed/Met   Problem: Elimination: Goal: Will not experience complications related to bowel motility Outcome: Completed/Met   Problem: Activity: Goal: Ability to tolerate increased activity will improve Outcome: Completed/Met   Problem: Life Cycle: Goal: Chance of risk for complications during the postpartum period will decrease Outcome: Completed/Met

## 2022-08-18 LAB — CULTURE, BETA STREP (GROUP B ONLY): Strep Gp B Culture: NEGATIVE

## 2022-08-19 ENCOUNTER — Other Ambulatory Visit: Payer: Self-pay | Admitting: *Deleted

## 2022-08-19 NOTE — Progress Notes (Signed)
TC to pt to notify FMLA paperwork for [employer] completed. Payment to be collected at start of business 08/20/22 and forms will be faxed then.

## 2022-08-20 LAB — SURGICAL PATHOLOGY

## 2022-08-21 ENCOUNTER — Encounter: Payer: Medicaid Other | Admitting: Advanced Practice Midwife

## 2022-08-22 ENCOUNTER — Inpatient Hospital Stay (HOSPITAL_COMMUNITY): Payer: Medicaid Other

## 2022-08-22 ENCOUNTER — Inpatient Hospital Stay (HOSPITAL_COMMUNITY)
Admission: AD | Admit: 2022-08-22 | Payer: Medicaid Other | Source: Home / Self Care | Admitting: Obstetrics and Gynecology

## 2022-08-22 ENCOUNTER — Telehealth (HOSPITAL_COMMUNITY): Payer: Self-pay

## 2022-08-22 NOTE — Telephone Encounter (Signed)
Patient reports feeling pretty good. Patient declines questions/concerns about her health and healing.  Patient reports that baby is doing good. Eating, peeing/pooping, and gaining weight well. Baby sleeps in a bassinet. RN reviewed ABC's of safe sleep with patient. Patient declines any questions or concerns about baby.  EPDS score is 1.  Sharyn Lull [facility]  08/22/22,1608

## 2022-08-28 ENCOUNTER — Encounter: Payer: Medicaid Other | Admitting: Certified Nurse Midwife

## 2022-09-13 ENCOUNTER — Encounter: Payer: Self-pay | Admitting: Obstetrics

## 2022-09-13 ENCOUNTER — Ambulatory Visit (INDEPENDENT_AMBULATORY_CARE_PROVIDER_SITE_OTHER): Payer: Medicaid Other | Admitting: Obstetrics

## 2022-09-13 DIAGNOSIS — Z975 Presence of (intrauterine) contraceptive device: Secondary | ICD-10-CM

## 2022-09-13 NOTE — Progress Notes (Signed)
..   [place] Partum Visit Note  Karina [name] is a 26 y.o. [phone] female who presents for a postpartum visit. She is 4 week postpartum following a normal spontaneous vaginal delivery.  I have fully reviewed the prenatal and intrapartum course. The delivery was at 36.0 gestational weeks.  Anesthesia: epidural. Postpartum course has been good. Baby is doing well. Baby is feeding by breast. Bleeding staining only. Bowel function is normal. Bladder function is normal. Patient is not sexually active. Contraception method is IUD. Postpartum depression screening: negative.   The pregnancy intention screening data noted above was reviewed. Potential methods of contraception were discussed. The patient elected to proceed with No data recorded.   Edinburgh Postnatal Depression Scale - 09/13/22 1034       Edinburgh Postnatal Depression Scale:  In the Past 7 Days   I have been able to laugh and see the funny side of things. 0    I have looked forward with enjoyment to things. 0    I have blamed myself unnecessarily when things went wrong. 0    I have been anxious or worried for no good reason. 0    I have felt scared or panicky for no good reason. 0    Things have been getting on top of me. 0    I have been so unhappy that I have had difficulty sleeping. 0    I have felt sad or miserable. 0    I have been so unhappy that I have been crying. 0    The thought of harming myself has occurred to me. 0    Edinburgh Postnatal Depression Scale Total 0             Health Maintenance Due  Topic Date Due   COVID-19 Vaccine (1) Never done   HPV VACCINES (1 - 2-dose series) Never done    The following portions of the patient's history were reviewed and updated as appropriate: allergies, current medications, past family history, past medical history, past social history, past surgical history, and problem list.  Review of Systems A comprehensive review of systems was negative.  Objective:  BP  104/60   Pulse 72   Wt 97 lb 9.6 oz (44.3 kg)   Breastfeeding Yes   BMI 18.44 kg/m    General:  alert and no distress   Breasts:  normal  Lungs: clear to auscultation bilaterally  Heart:  regular rate and rhythm, S1, S2 normal, no murmur, click, rub or gallop  Abdomen: soft, non-tender; bowel sounds normal; no masses,  no organomegaly   Wound none  GU exam:  not indicated       Assessment:    1. Encounter for postpartum care and examination after delivery - doing well  2. IUD (intrauterine device) in place    Plan:   Essential components of care per ACOG recommendations:  1.  Mood and well being: Patient with negative depression screening today.  - Patient tobacco use? No.   - hx of drug use? No.    2. Infant care and feeding:  -Patient currently breastmilk feeding? Yes. Discussed returning to work and pumping. Reviewed importance of draining breast regularly to support lactation.  -Social determinants of health (SDOH) reviewed in EPIC. No concerns  3. Sexuality, contraception and birth spacing - Patient does not want a pregnancy in the next year.  Desired family size is 3 children.  - Reviewed reproductive life planning. Reviewed contraceptive methods based on pt preferences and  effectiveness.  Patient desired IUD or IUS today.   - Discussed birth spacing of 18 months  4. Sleep and fatigue -Encouraged family/partner/community support of 4 hrs of uninterrupted sleep to help with mood and fatigue  5. Physical Recovery  - Discussed patients delivery and complications. She describes her labor as good. - Patient had a Vaginal, no problems at delivery. Patient had a 1st degree laceration. Perineal healing reviewed. Patient expressed understanding - Patient has urinary incontinence? No. - Patient is safe to resume physical and sexual activity  6.  Health Maintenance - HM due items addressed Yes - Last pap smear  Diagnosis  Date Value Ref Range Status  10/25/2020    Final   - Negative for intraepithelial lesion or malignancy (NILM)   Pap smear not done at today's visit.  -Breast Cancer screening indicated? No.   7. Chronic Disease/Pregnancy Condition follow up: None  Follow up in 4 weeks  Coral Ceo, MD Center for [facility], [facility], [facility] 09/13/22

## 2022-10-25 ENCOUNTER — Ambulatory Visit: Payer: Medicaid Other | Admitting: Obstetrics

## 2023-01-06 ENCOUNTER — Encounter: Payer: Self-pay | Admitting: Medical

## 2023-01-06 ENCOUNTER — Ambulatory Visit (INDEPENDENT_AMBULATORY_CARE_PROVIDER_SITE_OTHER): Payer: Medicaid Other | Admitting: Medical

## 2023-01-06 VITALS — BP 103/65 | HR 86 | Ht 61.0 in | Wt 89.6 lb

## 2023-01-06 DIAGNOSIS — Z30431 Encounter for routine checking of intrauterine contraceptive device: Secondary | ICD-10-CM | POA: Diagnosis not present

## 2023-01-06 NOTE — Progress Notes (Signed)
   History:  [name] is a 27 y.o. [phone] who presents to clinic today for IUD check. IUD placed PP on 08/15/22. Strings were not checked at [facility] visit in November. Recently patient noted strings were visible external to the vagina and has noted a more recent onset of bleeding and pelvic pain.  Bleeding is light to moderate. No changes in breastfeeding.   The following portions of the patient's history were reviewed and updated as appropriate: allergies, current medications, family history, past medical history, social history, past surgical history and problem list.  Review of Systems:  Review of Systems  Constitutional:  Negative for fever.  Gastrointestinal:  Positive for abdominal pain.  Genitourinary:        + vaginal bleeding      Objective:  Physical Exam BP 103/65   Pulse 86   Ht 5' 1"$  (1.549 m)   Wt 89 lb 9.6 oz (40.6 kg)   Breastfeeding Yes   BMI 16.93 kg/m  Physical Exam Exam conducted with a chaperone present.  Constitutional:      Appearance: Normal appearance. She is not ill-appearing.  Cardiovascular:     Rate and Rhythm: Normal rate.  Pulmonary:     Effort: Pulmonary effort is normal.  Abdominal:     General: Abdomen is flat. There is no distension.     Palpations: Abdomen is soft.  Genitourinary:    General: Normal vulva.     Vagina: Vaginal discharge (small mucous) and bleeding (scant) present.     Cervix: Discharge (mucous) present. No friability, lesion, erythema or cervical bleeding.  Neurological:     Mental Status: She is alert and oriented to person, place, and time.  Psychiatric:        Mood and Affect: Mood normal.     Health Maintenance Due  Topic Date Due   COVID-19 Vaccine (1) Never done   HPV VACCINES (1 - 2-dose series) Never done    Labs, imaging and previous visits in Epic reviewed  Assessment & Plan:  1. IUD check up - IUD strings trimmed back to 3 cm from the os - Patient tolerated well - Advised to monitor pain  and bleeding x 1-2 weeks, if no improvement will order [place] to assess IUD placement - IUD string length at this time is more likely due to post placental placement and reduction in size of the uterus than malposition - Back-up method of birth control recommended in the meantime to ensure adequate coverage  - Patient to return to [facility] PRN or if symptoms worsen  Karina [name] 01/06/2023 3:37 PM

## 2023-01-06 NOTE — Progress Notes (Signed)
Reports IUD string hanging out several inches, then onset of pelvic pain and VB. Not soaking pad.

## 2023-01-21 ENCOUNTER — Other Ambulatory Visit: Payer: Self-pay | Admitting: Medical

## 2023-01-21 DIAGNOSIS — R102 Pelvic and perineal pain: Secondary | ICD-10-CM

## 2023-01-21 DIAGNOSIS — Z975 Presence of (intrauterine) contraceptive device: Secondary | ICD-10-CM

## 2023-01-29 ENCOUNTER — Ambulatory Visit (HOSPITAL_COMMUNITY): Payer: Medicaid Other

## 2023-02-07 ENCOUNTER — Ambulatory Visit (HOSPITAL_COMMUNITY)
Admission: RE | Admit: 2023-02-07 | Discharge: 2023-02-07 | Disposition: A | Discharge status: Home / Self Care | Payer: Medicaid Other | Source: Ambulatory Visit | Attending: Medical | Admitting: Medical

## 2023-02-07 DIAGNOSIS — Z975 Presence of (intrauterine) contraceptive device: Secondary | ICD-10-CM | POA: Insufficient documentation

## 2023-02-07 DIAGNOSIS — R102 Pelvic and perineal pain: Secondary | ICD-10-CM | POA: Insufficient documentation

## 2023-03-18 ENCOUNTER — Other Ambulatory Visit: Payer: Self-pay | Admitting: Medical

## 2023-03-18 DIAGNOSIS — N921 Excessive and frequent menstruation with irregular cycle: Secondary | ICD-10-CM

## 2023-03-18 MED ORDER — NORETHINDRONE 0.35 MG PO TABS: 1.0000 | ORAL_TABLET | Freq: Every day | ORAL | 2 refills | Status: AC
